# Patient Record
Sex: Male | Born: 1975 | Race: White | Hispanic: No | Marital: Married | Smoking: Never smoker
Health system: Southern US, Community
[De-identification: ages and names within clinical notes are randomized; demographics above are authoritative.]

## PROBLEM LIST (undated history)

## (undated) DIAGNOSIS — E785 Hyperlipidemia, unspecified: Secondary | ICD-10-CM

## (undated) DIAGNOSIS — I10 Essential (primary) hypertension: Secondary | ICD-10-CM

## (undated) HISTORY — DX: Hyperlipidemia, unspecified: E78.5

## (undated) HISTORY — PX: KIDNEY SURGERY: SHX687

## (undated) HISTORY — PX: WISDOM TOOTH EXTRACTION: SHX21

## (undated) HISTORY — DX: Essential (primary) hypertension: I10

---
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
## 2000-07-13 ENCOUNTER — Emergency Department (HOSPITAL_COMMUNITY): Admission: EM | Admit: 2000-07-13 | Discharge: 2000-07-13 | Payer: Self-pay | Admitting: Emergency Medicine

## 2001-05-01 ENCOUNTER — Emergency Department (HOSPITAL_COMMUNITY): Admission: EM | Admit: 2001-05-01 | Discharge: 2001-05-01 | Payer: Self-pay | Admitting: Emergency Medicine

## 2006-06-27 ENCOUNTER — Ambulatory Visit: Payer: Self-pay | Admitting: Gastroenterology

## 2011-05-03 NOTE — Consult Note (Signed)
NAME:  KOHEI, ANTONELLIS NO.:  [id number]   MEDICAL RECORD NO.:  [id number]          PATIENT TYPE:  AMB   LOCATION:                                FACILITY:  [facility]   PHYSICIAN:  Kassie Mends, M.D.           DATE OF BIRTH:   DATE OF CONSULTATION:  06/27/2006  DATE OF DISCHARGE:                                   CONSULTATION   Laverle Hobby, M.D.  [address]  [place], [place] [address]   Dear Dr. Wende Crease,   I am seeing Mr. Godsey as a new patient consultation per your request.  I am  seeing him for a history of ulcerative colitis.   Mr. Zanni is a 35 year old male who reports being diagnosed with ulcerative  colitis in 80 or 1995.  His diagnosis was made in [place].  A physician  made his diagnosis; his office is not available, and obtaining the original  pathology report may be challenging.  He reports having 2-to-3 flares in the  last 5 years.  He has been maintained on no long-term medication.  He was  seen in your office in June 2007 complaining of rectal bleeding.  He was  prescribed sulfasalazine 500 milligrams 2 q.i.d.  He has been fairly  compliant with his regimen.  The bleeding and the cramping have pretty much  resolved.  He saw a little bleeding yesterday.  He denies any fever.  He is  having one bowel movement a day.  He denies any jaundice.  He has no  nocturnal stools; rarely does he have rectal pain.  He usually has rectal  urgency.   His past medical history includes low back pain and asthma.  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  He is allergic  to penicillin.  He is currently on sulfasalazine.  He has no family history  of colon cancer or colon polyps.  He has a family history of diabetes,  arthritis, and blood clots.  He has one to two beers a month.  He denies any  tobacco use.  He does drafting for a living.  He has been married for 6  years.  His review of systems is as per the history of present  illness,  otherwise, all systems are negative.   On physical exam:  Weight 284 lbs., BMI 38.4 (obese), temperature 98.2.  Blood pressure 142/90, pulse 76.  In general he is in no apparent distress,  alert and oriented x4.  HEENT exam is atraumatic normocephalic.  Pupils  equally and reacted to light.  Mouth--no oral lesions.  Posterior pharynx  without erythema or exudate.  His neck has full range of motion and no  lymphadenopathy. His lungs are clear to auscultation bilaterally.  His  cardiovascular exam is a regular rhythm, no murmur, normal S1 and S2.  His  abdomen: bowel sounds are present, soft, nontender, nondistended.  No  rebound or guarding.  Obese.  No hepatosplenomegaly.  He has no focal  neurologic deficits.  His extremities are without cyanosis, clubbing,  or  edema. Rectal exam: deferred   Mr. Largo is a 35 year old male with a reported history of ulcerative  colitis.  He has currently had symptomatic improvement with sulfasalazine.  He has some difficulty complying with the therapy due to it being a QID  drug.  He has no evidence of infectious colitis.  He has had the diagnosis  of ulcerative colitis for approximately 13-14 years.  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  He should have surveillance via  colonoscopy every year with biopsies.  Thank you for allowing me to see Mr.  Drost in consultation.  My list of recommendations follow.   Recommend changing to once a day mesalamine preparation if his insurance  will pay for it.  He is given a prescription for Lialda 1.2 gram tablets to  take 2 p.o. daily.  If his insurance will not pay, I may change him to  Asacol 3x a day.  He will be scheduled for a colonoscopy.  Biopsies will be  obtained every 10 cm.  I did discuss with him, that he has dysplasia is  present, then he should have a total colectomy with ileoanal pouch.  He  is  given a handout from the [facility] [facility] [place] in  regards to this ulcerative colitis; and surgery options for ulcerative  colitis.  He was given a handout on how to use the Lialda and the side  effects associated with it.  He should avoid NSAIDs. I will check a CBC  because of his reported history of rectal bleeding.  He also will have a  hepatic function panel today to screen for liver function abnormalities due  to the association between ulcerative colitis and primary sclerosing  cholangitis.  He is asked to try to obtain records from [place]. He will  follow up with me in 3 months.   Please feel free to contact me with additional questions at [phone].      Kassie Mends, M.D.  Electronically Signed     SM/MEDQ  D:  06/27/2006  T:  06/27/2006  Job:  [id number]   cc:   Laverle Hobby, M.D.  [address]  [place], [place] [address]

## 2012-01-10 ENCOUNTER — Other Ambulatory Visit: Payer: Self-pay | Admitting: Urology

## 2012-01-10 ENCOUNTER — Ambulatory Visit (INDEPENDENT_AMBULATORY_CARE_PROVIDER_SITE_OTHER): Payer: Managed Care, Other (non HMO) | Admitting: Urology

## 2012-01-10 DIAGNOSIS — R3915 Urgency of urination: Secondary | ICD-10-CM

## 2012-01-10 DIAGNOSIS — R310 Gross hematuria: Secondary | ICD-10-CM

## 2012-01-14 ENCOUNTER — Other Ambulatory Visit: Payer: Self-pay | Admitting: Urology

## 2012-01-15 ENCOUNTER — Ambulatory Visit: Payer: Self-pay | Admitting: Gastroenterology

## 2012-01-16 ENCOUNTER — Ambulatory Visit (HOSPITAL_COMMUNITY)
Admission: RE | Admit: 2012-01-16 | Discharge: 2012-01-16 | Disposition: A | Discharge status: Home / Self Care | Payer: Managed Care, Other (non HMO) | Source: Ambulatory Visit | Attending: Urology | Admitting: Urology

## 2012-01-16 DIAGNOSIS — R310 Gross hematuria: Secondary | ICD-10-CM | POA: Insufficient documentation

## 2012-01-16 DIAGNOSIS — Z905 Acquired absence of kidney: Secondary | ICD-10-CM | POA: Insufficient documentation

## 2012-01-16 MED ORDER — IOHEXOL 300 MG/ML  SOLN: 150.0000 mL | Freq: Once | INTRAMUSCULAR | Status: DC | PRN

## 2012-01-16 MED ORDER — IOHEXOL 300 MG/ML  SOLN
150.0000 mL | Freq: Once | INTRAMUSCULAR | Status: AC | PRN
  Administered 2012-01-16: 150 mL via INTRAVENOUS

## 2012-02-28 ENCOUNTER — Ambulatory Visit (INDEPENDENT_AMBULATORY_CARE_PROVIDER_SITE_OTHER): Payer: Managed Care, Other (non HMO) | Admitting: Urology

## 2012-02-28 DIAGNOSIS — N342 Other urethritis: Secondary | ICD-10-CM

## 2012-02-28 DIAGNOSIS — R310 Gross hematuria: Secondary | ICD-10-CM

## 2012-09-04 ENCOUNTER — Ambulatory Visit (INDEPENDENT_AMBULATORY_CARE_PROVIDER_SITE_OTHER): Payer: Managed Care, Other (non HMO) | Admitting: Urology

## 2012-09-04 DIAGNOSIS — N342 Other urethritis: Secondary | ICD-10-CM

## 2012-09-04 DIAGNOSIS — R310 Gross hematuria: Secondary | ICD-10-CM

## 2013-04-30 IMAGING — CT CT ABD-PEL WO/W CM
2 of 7 series · 13 of 46 positions shown, 18 images · IV contrast (Omnipaque 300)
Comparison: None.

CLINICAL DATA: Gross hematuria for 6 months.  History of [DATE] of the
left kidney being removed secondary to a "birth defect".

CT ABDOMEN AND PELVIS WITHOUT AND WITH CONTRAST
TECHNIQUE: Multidetector CT imaging of the abdomen and pelvis was
performed without contrast material in one or both body regions,
followed by contrast material(s) and further sections in one or
both body regions.
Contrast: 150mL OMNIPAQUE IOHEXOL 300 MG/ML IV SOLN

[Series 3: hematuria axial pre contrast · axial · non-contrast · 0.85mm/px · z∈[-458,-24]mm · 10 of 103 slices shown, 15 images]
[im 8/103  soft-tissue]
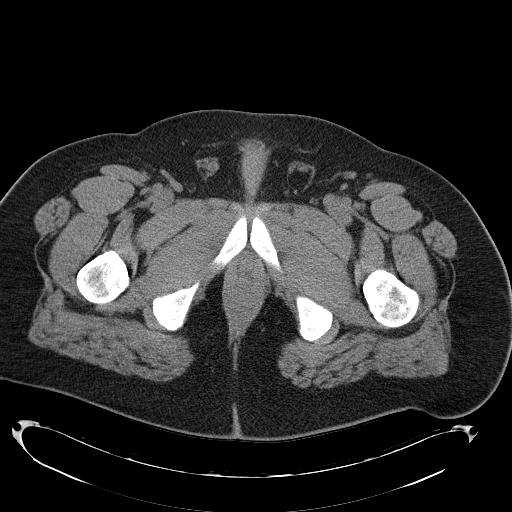
[im 8/103  bone]
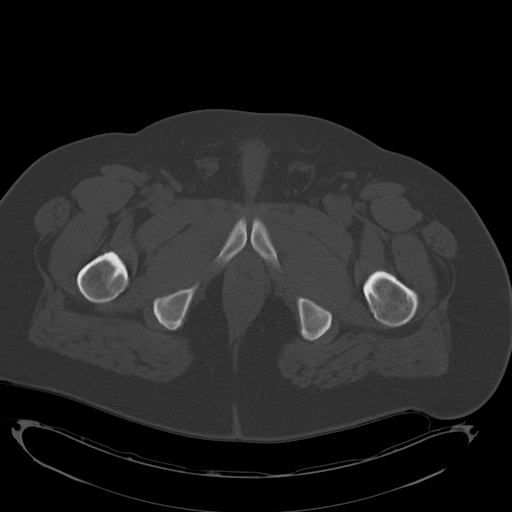
[im 24/103  soft-tissue]
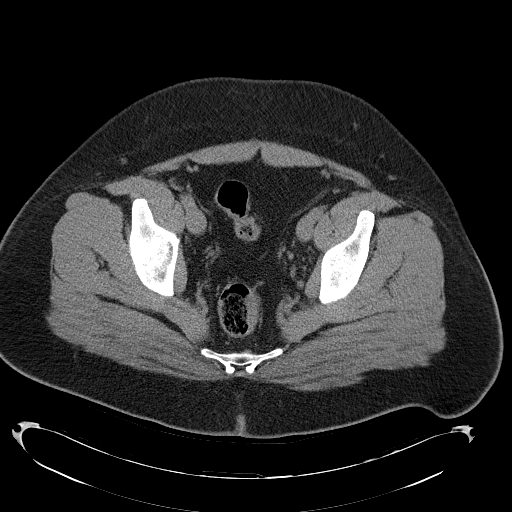
[im 32/103  soft-tissue]
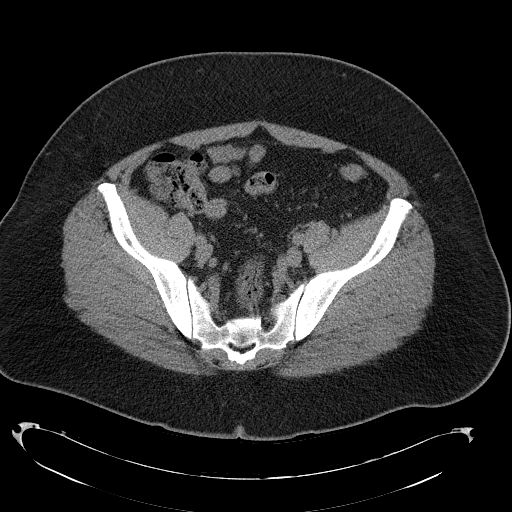
[im 40/103  soft-tissue]
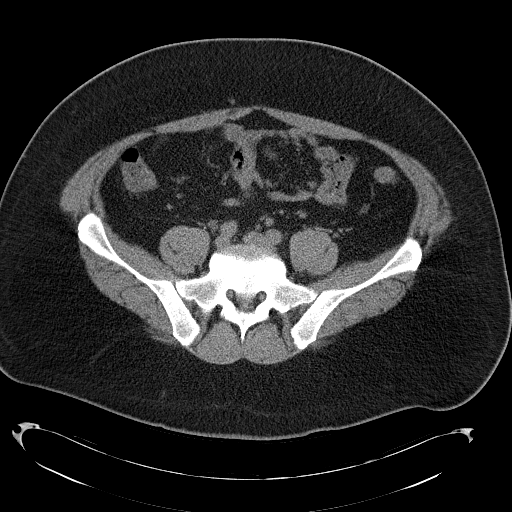
[im 55/103  soft-tissue]
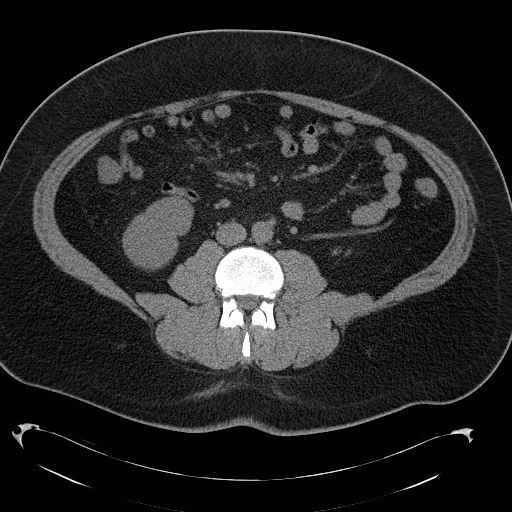
[im 63/103  soft-tissue]
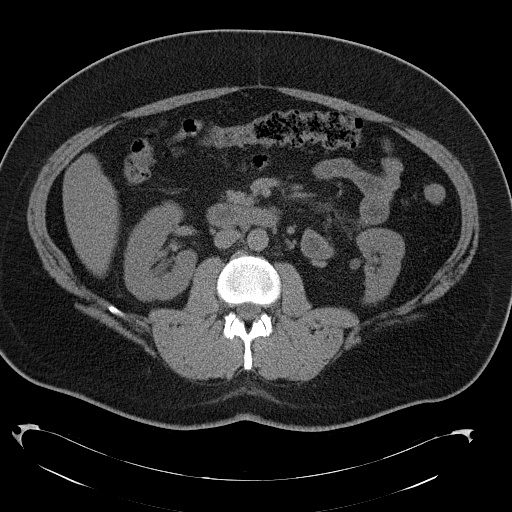
[im 71/103  soft-tissue]
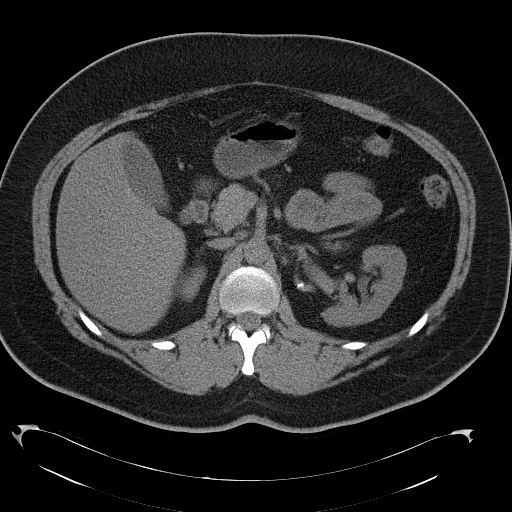
[im 71/103  lung]
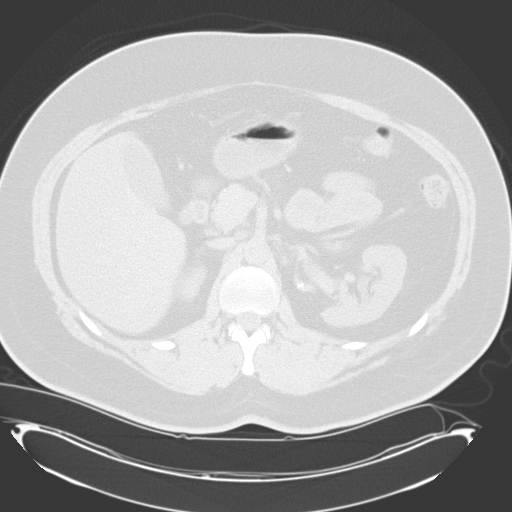
[im 79/103  lung]
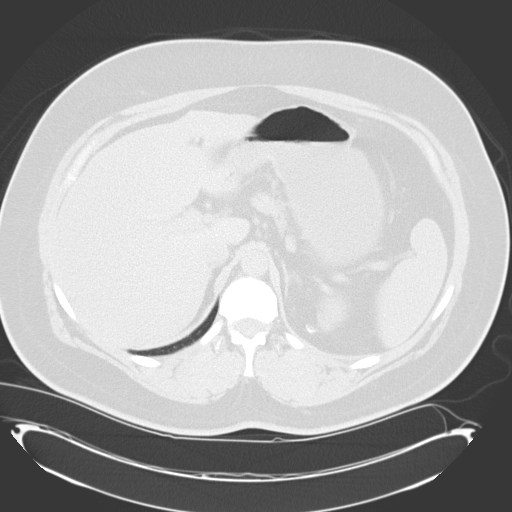
[im 87/103  soft-tissue]
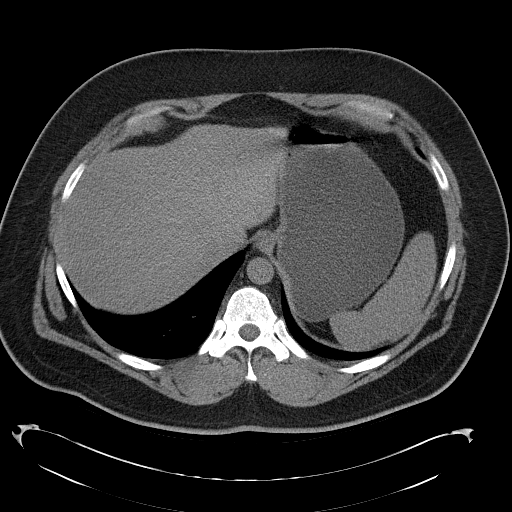
[im 87/103  lung]
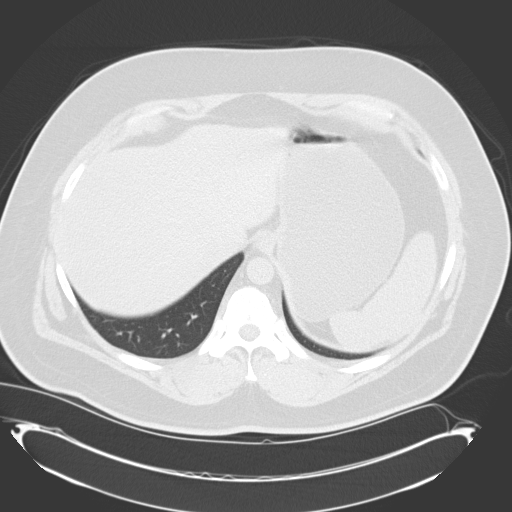
[im 95/103  soft-tissue]
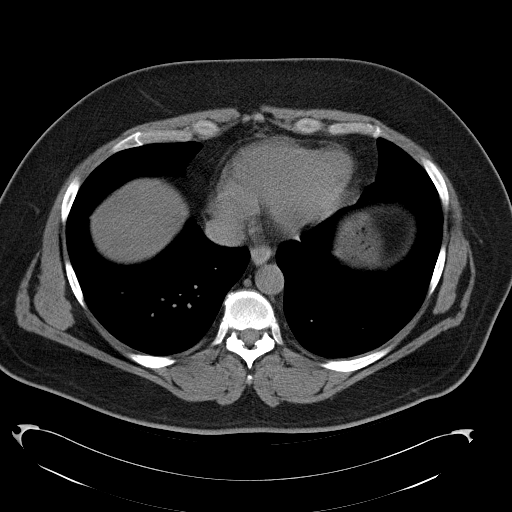
[im 95/103  lung]
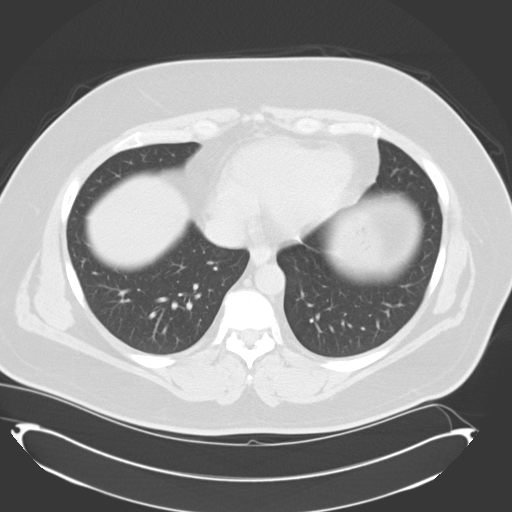
[im 95/103  bone]
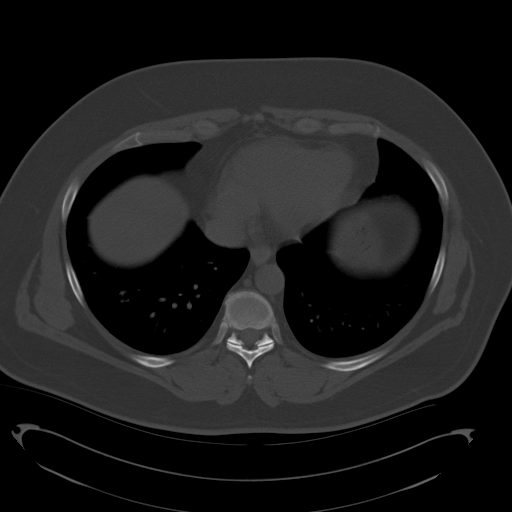

[Series 4: hematuria mpr coro pre (id) · coronal · non-contrast · 0.86mm/px · 3 of 95 slices shown]
[im 24/95  soft-tissue]
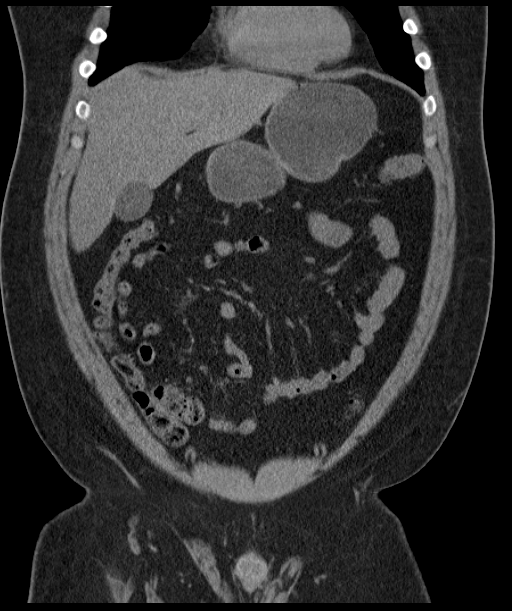
[im 48/95  soft-tissue]
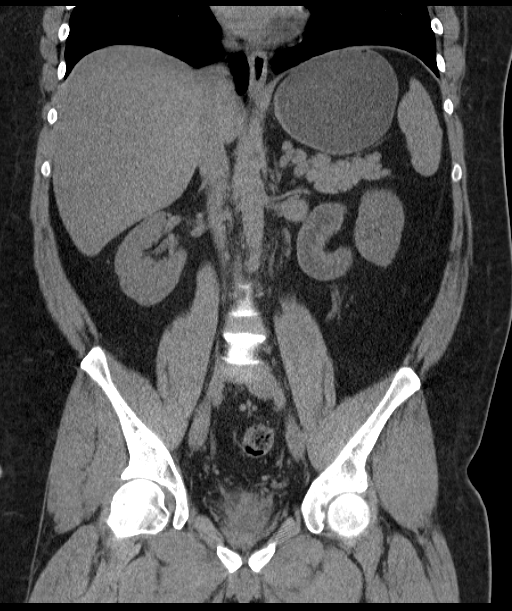
[im 71/95  soft-tissue]
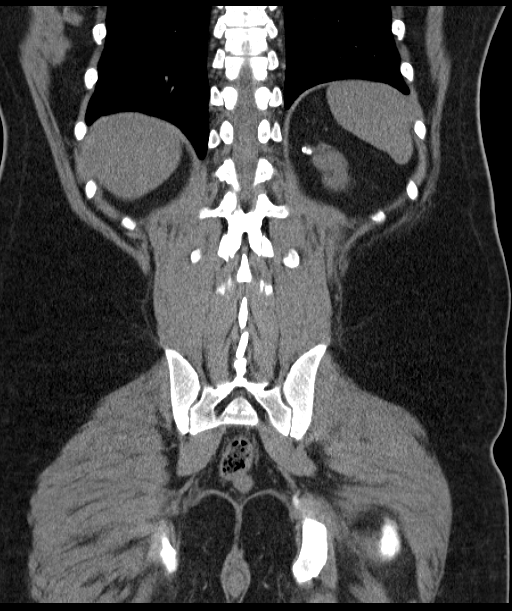

[13 of 46 positions shown; findings below may reference images not displayed]

FINDINGS: Lung Bases:  The visualized lung parenchyma is clear.  No pleural
effusions.  Visualized portions of the heart are unremarkable.

Abdomen/Pelvis:  Pre and post contrast images of the liver,
gallbladder, pancreas, spleen, right kidney and bilateral adrenal
glands are unremarkable.  Postoperative changes of partial left
nephrectomy are noted.  There is a small dystrophic calcification
at the superior aspect of the left kidney.  Numerous clips are
present around the left renal hilum.  A single calcification is
present in the left renal hilum (image 32 of series 3) measuring 3
mm.  This does not appear to be associated with the collecting
system, and is also favored to represent a dystrophic calcification
related to prior surgery.  No ureteral calculi or hydroureter
nephrosis is identified on either side.  Prone delayed postcontrast
images demonstrate no definite filling defects within either
collecting system, or within either ureter.  No filling defects
noted within the urinary bladder.  Normal appendix.  No ascites or
pneumoperitoneum and no pathologic distension of bowel.  No
pathologic lymphadenopathy.  Prostate gland is unremarkable in
appearance.

Musculoskeletal:  No aggressive appearing lytic or blastic lesions
noted in the visualized portions of the skeleton.
IMPRESSION: 1.  Status post partial left nephrectomy with two foci of
dystrophic calcification associated with the left kidney (an
expected postoperative change).  No findings to account for the
patient's hematuria.  Specifically, no evidence of urothelial
neoplasm, no renal neoplasm, or abnormal urinary tract
calcifications.

## 2013-08-31 ENCOUNTER — Telehealth: Payer: Self-pay

## 2013-08-31 NOTE — Telephone Encounter (Signed)
Pt was referred by Dr. Dwana Melena for screening colonoscopy ( and ulcerative colitis). I called and LMOM for pt to return call to schedule ov appt.

## 2013-09-15 NOTE — Telephone Encounter (Signed)
Pt left VM that he is ready to schedule OV. I called, got VM and left message for a return call .

## 2013-09-15 NOTE — Telephone Encounter (Signed)
CS has made OV for pt on 10/22 at 230

## 2013-10-06 ENCOUNTER — Ambulatory Visit (INDEPENDENT_AMBULATORY_CARE_PROVIDER_SITE_OTHER): Payer: Managed Care, Other (non HMO) | Admitting: Gastroenterology

## 2013-10-06 ENCOUNTER — Encounter: Payer: Self-pay | Admitting: Gastroenterology

## 2013-10-06 ENCOUNTER — Encounter (INDEPENDENT_AMBULATORY_CARE_PROVIDER_SITE_OTHER): Payer: Self-pay

## 2013-10-06 VITALS — BP 128/82 | HR 88 | Temp 97.0°F | Ht 72.0 in | Wt 297.6 lb

## 2013-10-06 DIAGNOSIS — K5190 Ulcerative colitis, unspecified, without complications: Secondary | ICD-10-CM | POA: Insufficient documentation

## 2013-10-06 MED ORDER — PEG 3350-KCL-NA BICARB-NACL 420 G PO SOLR: 4000.0000 mL | ORAL | Status: DC

## 2013-10-06 NOTE — Progress Notes (Signed)
Referring Provider: Catalina Pizza, MD Primary Care Physician:  Catalina Pizza, MD Primary Gastroenterologist:  Dr. Jena Gauss   Chief Complaint  Patient presents with  . Colonoscopy    HPI:   Terry Woodward is a pleasant 37 year old male who has a history of ulcerative colitis, originally diagnosed in 28. This was made in [place] at time of colonoscopy. He states he was never provided any medication from diagnosing physician. He has not had a lower GI evaluation since then. He was actually seen by our office in 2007 and provided Lialda. Took this for awhile, but he has been off all medications for years. Will periodically have low-volume hematochezia, painless in nature. BM every day like clockwork, no diarrhea. Flare once a year, resulting in about a day off from work. Ends up with large amount of mucus, rectal bleeding, more frequent, watery stools, cramps. Advil intermittently for arthritis, headaches. No aspirin powders.   Past Medical History  Diagnosis Date  . Dyslipidemia   . Hypertension     Past Surgical History  Procedure Laterality Date  . Colonoscopy  1994    dx: UC  . 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 Wisdom tooth extraction      No current outpatient prescriptions on file.   No current facility-administered medications for this visit.    Allergies as of 10/06/2013 - Review Complete 10/06/2013  Allergen Reaction Noted  . Penicillins Other (See Comments) 10/06/2013    Family History  Problem Relation Age of Onset  . Colon cancer Maternal Grandmother   . Colon cancer Maternal Grandfather   . Colon cancer Paternal Grandmother   . Ovarian cancer Mother     History   Social History  . Marital Status: Single    Spouse Name: N/A    Number of Children: N/A  . Years of Education: N/A   Occupational History  . [employer]    Social History Main Topics  . Smoking status: Never Smoker   . Smokeless tobacco: Not on file   . Alcohol Use: Yes     Comment: 1-2 beers a month  . Drug Use: No  . Sexual Activity: Not on file   Other Topics Concern  . Not on file   Social History Narrative  . No narrative on file    Review of Systems: Gen: Denies any fever, chills, loss of appetite, fatigue, weight loss. CV: Denies chest pain, heart palpitations, syncope, peripheral edema. Resp: Denies shortness of breath with rest, cough, wheezing GI: see HPI GU : Denies urinary burning, urinary frequency, urinary incontinence.  MS: arthritis in back  Derm: Denies rash, itching, dry skin Psych: Denies depression, anxiety, confusion or memory loss  Heme: Denies bruising, bleeding, and enlarged lymph nodes.  Physical Exam: BP 128/82  Pulse 88  Temp(Src) 97 F (36.1 C) (Oral)  Ht 6' (1.829 m)  Wt 297 lb 9.6 oz (134.99 kg)  BMI 40.35 kg/m2 General:   Alert and oriented. Well-developed, well-nourished, pleasant and cooperative. Head:  Normocephalic and atraumatic. Eyes:  Conjunctiva pink, sclera clear, no icterus.   Conjunctiva pink. Ears:  Normal auditory acuity. Nose:  No deformity, discharge,  or lesions. Mouth:  Lower jaw protrudes forward, resulting in underbite Neck:  Supple, without mass or thyromegaly. Lungs:  Clear to auscultation bilaterally, without wheezing, rales, or rhonchi.  Heart:  S1, S2 present without murmurs noted.  Abdomen:  +BS, soft, non-tender and non-distended. Without mass  or HSM. No rebound or guarding. No hernias noted. Rectal:  Deferred  Msk:  Symmetrical without gross deformities. Normal posture. Extremities:  Without clubbing or edema. Neurologic:  Alert and  oriented x4;  grossly normal neurologically. Skin:  Intact, warm and dry without significant lesions or rashes Cervical Nodes:  No significant cervical adenopathy. Psych:  Alert and cooperative. Normal mood and affect.  Outside labs March 2014: Hgb 15.3, Hct 43.8 LFTs normal March 2014

## 2013-10-06 NOTE — Patient Instructions (Signed)
We have scheduled you for a colonoscopy with Dr. Rourk in the near future.  Further recommendations to follow!   

## 2013-10-06 NOTE — Assessment & Plan Note (Signed)
37 year old male with reported history of ulcerative colitis, diagnosed in 57 while living in [place], now with need for surveillance colonoscopy. He has not had any lower GI evaluation since original diagnosis; he also denies taking any medication. Path reports unavailable at time of visit. Appears he will have a flare around once a year, but otherwise, he is doing quite well.   Proceed with TCS with Dr. Jena Gauss in near future: the risks, benefits, and alternatives have been discussed with the patient in detail. The patient states understanding and desires to proceed. Further recommendations including medication regimen after colonoscopy

## 2013-10-07 NOTE — Progress Notes (Signed)
cc'd to pcp 

## 2013-10-13 LAB — CBC WITH DIFFERENTIAL/PLATELET
HCT: 44 %
HGB: 15.3 g/dL
platelet count: 301

## 2013-10-13 LAB — COMPREHENSIVE METABOLIC PANEL
AST: 22 U/L
Total Bilirubin: 0.8 mg/dL

## 2013-10-14 ENCOUNTER — Encounter (HOSPITAL_COMMUNITY): Payer: Self-pay | Admitting: Pharmacy Technician

## 2013-10-29 ENCOUNTER — Ambulatory Visit (HOSPITAL_COMMUNITY)
Admission: RE | Admit: 2013-10-29 | Discharge: 2013-10-29 | Disposition: A | Discharge status: Home / Self Care | Payer: Managed Care, Other (non HMO) | Source: Ambulatory Visit | Attending: Internal Medicine | Admitting: Internal Medicine

## 2013-10-29 ENCOUNTER — Encounter (HOSPITAL_COMMUNITY): Payer: Self-pay | Admitting: *Deleted

## 2013-10-29 ENCOUNTER — Encounter (HOSPITAL_COMMUNITY)
Admission: RE | Disposition: A | Discharge status: Home / Self Care | Payer: Self-pay | Source: Ambulatory Visit | Attending: Internal Medicine

## 2013-10-29 DIAGNOSIS — Z1211 Encounter for screening for malignant neoplasm of colon: Secondary | ICD-10-CM

## 2013-10-29 DIAGNOSIS — Z800 Family history of malignant neoplasm of digestive organs: Secondary | ICD-10-CM | POA: Insufficient documentation

## 2013-10-29 DIAGNOSIS — E785 Hyperlipidemia, unspecified: Secondary | ICD-10-CM | POA: Insufficient documentation

## 2013-10-29 DIAGNOSIS — K5190 Ulcerative colitis, unspecified, without complications: Secondary | ICD-10-CM

## 2013-10-29 DIAGNOSIS — I10 Essential (primary) hypertension: Secondary | ICD-10-CM | POA: Insufficient documentation

## 2013-10-29 HISTORY — PX: COLONOSCOPY: SHX5424

## 2013-10-29 SURGERY — COLONOSCOPY
Anesthesia: Moderate Sedation

## 2013-10-29 MED ORDER — MIDAZOLAM HCL 5 MG/5ML IJ SOLN
INTRAMUSCULAR | Status: AC
  Filled 2013-10-29: qty 10

## 2013-10-29 MED ORDER — MEPERIDINE HCL 100 MG/ML IJ SOLN
INTRAMUSCULAR | Status: DC | PRN
  Administered 2013-10-29 (×2): 50 mg via INTRAVENOUS
  Administered 2013-10-29: 25 mg via INTRAVENOUS

## 2013-10-29 MED ORDER — SODIUM CHLORIDE 0.9 % IV SOLN
INTRAVENOUS | Status: DC
  Administered 2013-10-29: 08:00:00 via INTRAVENOUS

## 2013-10-29 MED ORDER — ONDANSETRON HCL 4 MG/2ML IJ SOLN
INTRAMUSCULAR | Status: AC
  Filled 2013-10-29: qty 2

## 2013-10-29 MED ORDER — MIDAZOLAM HCL 5 MG/5ML IJ SOLN
INTRAMUSCULAR | Status: DC | PRN
  Administered 2013-10-29 (×2): 2 mg via INTRAVENOUS
  Administered 2013-10-29 (×2): 1 mg via INTRAVENOUS

## 2013-10-29 MED ORDER — ONDANSETRON HCL 4 MG/2ML IJ SOLN
INTRAMUSCULAR | Status: DC | PRN
  Administered 2013-10-29: 4 mg via INTRAVENOUS

## 2013-10-29 MED ORDER — MEPERIDINE HCL 100 MG/ML IJ SOLN
INTRAMUSCULAR | Status: AC
  Filled 2013-10-29: qty 2

## 2013-10-29 MED ORDER — STERILE WATER FOR IRRIGATION IR SOLN
Status: DC | PRN
  Administered 2013-10-29: 09:00:00

## 2013-10-29 NOTE — Interval H&P Note (Signed)
History and Physical Interval Note:  10/29/2013 8:30 AM  Terry Woodward  has presented today for surgery, with the diagnosis of ULCERATIVE COLITIS  The various methods of treatment have been discussed with the patient and family. After consideration of risks, benefits and other options for treatment, the patient has consented to  Procedure(s) with comments: COLONOSCOPY (N/A) - 8:45 as a surgical intervention .  The patient's history has been reviewed, patient examined, no change in status, stable for surgery.  I have reviewed the patient's chart and labs.  Questions were answered to the patient's satisfaction.     Terry Woodward  No change. Colonoscopy per plan.  The risks, benefits, limitations, alternatives and imponderables have been reviewed with the patient. Questions have been answered. All parties are agreeable.

## 2013-10-29 NOTE — H&P (View-Only) (Signed)
 Referring Provider: Hall, Zach, MD Primary Care Physician:  HALL, ZACH, MD Primary Gastroenterologist:  Dr. Rourk   Chief Complaint  Patient presents with  . Colonoscopy    HPI:   Mr. Heldman is a pleasant 37-year-old male who has a history of ulcerative colitis, originally diagnosed in 1994. This was made in [place] at time of colonoscopy. He states he was never provided any medication from diagnosing physician. He has not had a lower GI evaluation since then. He was actually seen by our office in 2007 and provided Lialda. Took this for awhile, but he has been off all medications for years. Will periodically have low-volume hematochezia, painless in nature. BM every day like clockwork, no diarrhea. Flare once a year, resulting in about a day off from work. Ends up with large amount of mucus, rectal bleeding, more frequent, watery stools, cramps. Advil intermittently for arthritis, headaches. No aspirin powders.   Past Medical History  Diagnosis Date  . Dyslipidemia   . Hypertension     Past Surgical History  Procedure Laterality Date  . Colonoscopy  1994    dx: UC  . 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 Wisdom tooth extraction      No current outpatient prescriptions on file.   No current facility-administered medications for this visit.    Allergies as of 10/06/2013 - Review Complete 10/06/2013  Allergen Reaction Noted  . Penicillins Other (See Comments) 10/06/2013    Family History  Problem Relation Age of Onset  . Colon cancer Maternal Grandmother   . Colon cancer Maternal Grandfather   . Colon cancer Paternal Grandmother   . Ovarian cancer Mother     History   Social History  . Marital Status: Single    Spouse Name: N/A    Number of Children: N/A  . Years of Education: N/A   Occupational History  . [employer]    Social History Main Topics  . Smoking status: Never Smoker   . Smokeless tobacco: Not on file   . Alcohol Use: Yes     Comment: 1-2 beers a month  . Drug Use: No  . Sexual Activity: Not on file   Other Topics Concern  . Not on file   Social History Narrative  . No narrative on file    Review of Systems: Gen: Denies any fever, chills, loss of appetite, fatigue, weight loss. CV: Denies chest pain, heart palpitations, syncope, peripheral edema. Resp: Denies shortness of breath with rest, cough, wheezing GI: see HPI GU : Denies urinary burning, urinary frequency, urinary incontinence.  MS: arthritis in back  Derm: Denies rash, itching, dry skin Psych: Denies depression, anxiety, confusion or memory loss  Heme: Denies bruising, bleeding, and enlarged lymph nodes.  Physical Exam: BP 128/82  Pulse 88  Temp(Src) 97 F (36.1 C) (Oral)  Ht 6' (1.829 m)  Wt 297 lb 9.6 oz (134.99 kg)  BMI 40.35 kg/m2 General:   Alert and oriented. Well-developed, well-nourished, pleasant and cooperative. Head:  Normocephalic and atraumatic. Eyes:  Conjunctiva pink, sclera clear, no icterus.   Conjunctiva pink. Ears:  Normal auditory acuity. Nose:  No deformity, discharge,  or lesions. Mouth:  Lower jaw protrudes forward, resulting in underbite Neck:  Supple, without mass or thyromegaly. Lungs:  Clear to auscultation bilaterally, without wheezing, rales, or rhonchi.  Heart:  S1, S2 present without murmurs noted.  Abdomen:  +BS, soft, non-tender and non-distended. Without mass   or HSM. No rebound or guarding. No hernias noted. Rectal:  Deferred  Msk:  Symmetrical without gross deformities. Normal posture. Extremities:  Without clubbing or edema. Neurologic:  Alert and  oriented x4;  grossly normal neurologically. Skin:  Intact, warm and dry without significant lesions or rashes Cervical Nodes:  No significant cervical adenopathy. Psych:  Alert and cooperative. Normal mood and affect.  Outside labs March 2014: Hgb 15.3, Hct 43.8 LFTs normal March 2014   

## 2013-10-29 NOTE — Op Note (Signed)
[facility] [address] [place], [address]   COLONOSCOPY PROCEDURE REPORT  PATIENT: Terry, Woodward  MR#:         [id number] BIRTHDATE: June 03, 1976 , 37  yrs. old GENDER: Male ENDOSCOPIST: R.  Roetta Sessions, MD FACP FACG REFERRED BY:  Catalina Pizza, M.D. PROCEDURE DATE:  10/29/2013 PROCEDURE:     Ileocolonoscopy with segmental biopsy  INDICATIONS: Reported history of ulcerative colitis with no GI symptoms currently; positive family history of colon cancer in 3 second-degree relatives but all at quite an advanced age  INFORMED CONSENT:  The risks, benefits, alternatives and imponderables including but not limited to bleeding, perforation as well as the possibility of a missed lesion have been reviewed.  The potential for biopsy, lesion removal, etc. have also been discussed.  Questions have been answered.  All parties agreeable. Please see the history and physical in the medical record for more information.  MEDICATIONS: Versed 6 mg IV Demerol 125 mg IV in divided doses. Zofran 4 mg IV  DESCRIPTION OF PROCEDURE:  After a digital rectal exam was performed, the EC-[id number] ([id number])  colonoscope was advanced from the anus through the rectum and colon to the area of the cecum, ileocecal valve and appendiceal orifice.  The cecum was deeply intubated.  These structures were well-seen and photographed for the record.  From the level of the cecum and ileocecal valve, the scope was slowly and cautiously withdrawn.  The mucosal surfaces were carefully surveyed utilizing scope tip deflection to facilitate fold flattening as needed.  The scope was pulled down into the rectum where a thorough examination including retroflexion was performed.    FINDINGS:  Adequate preparation. Normal-appearing rectal mucosa. The colonic mucosa appeared normal as well. The distal 10 cm of terminal ileal mucosa also appeared normal.  THERAPEUTIC / DIAGNOSTIC MANEUVERS PERFORMED:  segmental  biopsies of the ascending, descending, sigmoid and rectal segments taken for histologic study  COMPLICATIONS: None  CECAL WITHDRAWAL TIME:  11 minutes  IMPRESSION:  Normal ileocolonoscopy-status post segmental biopsy. Patient's clinical course over the past 20 years not typical of idiopathic inflammatory bowel disease.  RECOMMENDATIONS: Followup on pathology. We'll attempt to get procedure note and pathology results from his original colonoscopy done in [place] some 2 decades ago.   _______________________________ eSigned:  R. Roetta Sessions, MD FACP [facility] 10/29/2013 9:05 AM   CC:

## 2013-11-01 ENCOUNTER — Encounter: Payer: Self-pay | Admitting: Internal Medicine

## 2013-11-02 ENCOUNTER — Encounter (HOSPITAL_COMMUNITY): Payer: Self-pay | Admitting: Internal Medicine

## 2013-11-24 ENCOUNTER — Telehealth: Payer: Self-pay

## 2013-11-24 NOTE — Telephone Encounter (Signed)
Pt's wife called and wanted to speak to someone in billing and I offered her the number for profee. She declined and said she wanted to speak to someone here. I told her my office manager is on the phone at this time and I will have her return your call at [phone].

## 2013-11-25 NOTE — Telephone Encounter (Signed)
I also informed the patient of this resubmission

## 2013-11-25 NOTE — Telephone Encounter (Signed)
Per Terry Woodward the bill will be resubmitted with the corrected codes: 1) V76.51; 2) 556.9 3) V16.0

## 2013-11-25 NOTE — Telephone Encounter (Signed)
I spoke with Laurence Slate, PB coding for [employer], states the V76.51 code was not used and it should have been used.  She is going to make the changes and resubmit the Claim.

## 2013-11-25 NOTE — Telephone Encounter (Signed)
I called and lm with Lavetta Nielsen concerning the hospital account below

## 2013-12-28 NOTE — Telephone Encounter (Signed)
I spoke with Terry LewisAnita Woodward and she stated there was never a resubmission with the correct codes.  She is going e-mail billing and make sure a resubmission of claim is done for Terry Woodward.

## 2013-12-28 NOTE — Telephone Encounter (Signed)
Patient's wife called and stated she was still receiving bills on this account.  I emailed Rosey Batheresa and Lavetta NielsenDonna Knowlton in hospital billing to have them follow-up on why this has not been corrected and resubmitted to his insurance company.

## 2013-12-28 NOTE — Telephone Encounter (Signed)
I called. Terry Woodward and lmom that the claim would be resubmitted.

## 2013-12-28 NOTE — Telephone Encounter (Signed)
Pt's wife Jeanice LimHolly [phone] would like for me to give her a call back.

## 2014-01-20 ENCOUNTER — Other Ambulatory Visit (HOSPITAL_COMMUNITY): Payer: Self-pay

## 2014-01-20 DIAGNOSIS — G479 Sleep disorder, unspecified: Secondary | ICD-10-CM

## 2014-02-18 ENCOUNTER — Encounter: Payer: Self-pay | Admitting: Neurology

## 2014-02-18 ENCOUNTER — Ambulatory Visit: Payer: BC Managed Care – PPO | Attending: Internal Medicine | Admitting: Sleep Medicine

## 2014-02-18 DIAGNOSIS — G479 Sleep disorder, unspecified: Secondary | ICD-10-CM

## 2014-02-18 DIAGNOSIS — G4733 Obstructive sleep apnea (adult) (pediatric): Secondary | ICD-10-CM | POA: Insufficient documentation

## 2014-02-27 NOTE — Sleep Study (Signed)
  [facility] Romie Keeble A. Gerilyn Pilgrimoonquah, MD     [email or web address]          LOCATION: SLEEP LAB FACILITY: [facility]  PHYSICIAN: Jlynn Langille A. Gerilyn Pilgrimoonquah, M.D.   DATE OF STUDY: 02/18/2014  NOCTURNAL POLYSOMNOGRAM   REFERRING PHYSICIAN:  Dwana MelenaZack Hall.  INDICATIONS: This is a 38 year old presents with snoring, obesity and fatigue.  MEDICATIONS:  Prior to Admission medications   Medication Sig Start Date End Date Taking? Authorizing Provider  ibuprofen (ADVIL,MOTRIN) 200 MG tablet Take 600 mg by mouth every 6 (six) hours as needed for pain.    Historical Provider, MD      EPWORTH SLEEPINESS SCALE: 4.   BMI: 40.   ARCHITECTURAL SUMMARY: Total recording time was 400 minutes. Sleep efficiency 87 %. Sleep latency 21 minutes. REM latency 212 minutes. Stage NI 9 %, N2 57 % and N3 19 % and REM sleep 14 %.    RESPIRATORY DATA:  Baseline oxygen saturation is 97 %. The lowest saturation is 89 %. The diagnostic AHI is 5. The RDI is 13. The REM AHI is 7.  LIMB MOVEMENT SUMMARY: PLM index 5.   ELECTROCARDIOGRAM SUMMARY: Average heart rate is 72 with no significant  dysrhythmias observed.   IMPRESSION:  1. Mild obstructive sleep apnea syndrome not requiring positive pressure treatment.  Thanks for this referral.  Maguire Sime A. Gerilyn Pilgrimoonquah, M.D. Diplomat, Biomedical engineerAmerican Board of Sleep Medicine.

## 2014-12-07 ENCOUNTER — Ambulatory Visit: Payer: Self-pay

## 2014-12-13 ENCOUNTER — Ambulatory Visit (INDEPENDENT_AMBULATORY_CARE_PROVIDER_SITE_OTHER): Payer: BC Managed Care – PPO

## 2014-12-13 ENCOUNTER — Ambulatory Visit: Payer: BC Managed Care – PPO

## 2014-12-13 VITALS — BP 112/70 | HR 88 | Resp 18

## 2014-12-13 DIAGNOSIS — R52 Pain, unspecified: Secondary | ICD-10-CM

## 2014-12-13 DIAGNOSIS — M2040 Other hammer toe(s) (acquired), unspecified foot: Secondary | ICD-10-CM

## 2014-12-13 DIAGNOSIS — M7750 Other enthesopathy of unspecified foot: Secondary | ICD-10-CM

## 2014-12-13 DIAGNOSIS — M6240 Contracture of muscle, unspecified site: Secondary | ICD-10-CM

## 2014-12-13 DIAGNOSIS — M2010 Hallux valgus (acquired), unspecified foot: Secondary | ICD-10-CM

## 2014-12-13 DIAGNOSIS — M779 Enthesopathy, unspecified: Secondary | ICD-10-CM

## 2014-12-13 DIAGNOSIS — M778 Other enthesopathies, not elsewhere classified: Secondary | ICD-10-CM

## 2014-12-13 NOTE — Patient Instructions (Signed)
Bunionectomy A bunionectomy is surgery to remove a bunion. A bunion is an enlargement of the joint at the base of the big toe. It is made up of bone and soft tissue on the inside part of the joint. Over time, a painful lump appears on the inside of the joint. The big toe begins to point inward toward the second toe. New bone growth can occur and a bone spur may form. The pain eventually causes difficulty walking. A bunion usually results from inflammation caused by the irritation of poorly fitting shoes. It often begins later in life. A bunionectomy is performed when nonsurgical treatment no longer works. When surgery is needed, the extent of the procedure will depend on the degree of deformity of the foot. Your surgeon will discuss with you the different procedures and what will work best for you depending on your age and health. LET YOUR CAREGIVER KNOW ABOUT:   Previous problems with anesthetics or medicines used to numb the skin.  Allergies to dyes, iodine, foods, and/or latex.  Medicines taken including herbs, eye drops, prescription medicines (especially medicines used to "thin the blood"), aspirin and other over-the-counter medicines, and steroids (by mouth or as a cream).  History of bleeding or blood problems.  Possibility of pregnancy, if this applies.  History of blood clots in your legs and/or lungs .  Previous surgery.  Other important health problems. RISKS AND COMPLICATIONS   Infection.  Pain.  Nerve damage.  Possibility that the bunion will recur. BEFORE THE PROCEDURE  You should be present 60 minutes prior to your procedure or as directed.  PROCEDURE  Surgery is often done so that you can go home the same day (outpatient). It may be done in a hospital or in an outpatient surgical center. An anesthetic will be used to help you sleep during the procedure. Sometimes, a spinal anesthetic is used to make you numb below the waist. A cut (incision) is made over the swollen  area at the first joint of the big toe. The enlarged lump will be removed. If there is a need to reposition the bones of the big toe, this may require more than 1 incision. The bone itself may need to be cut. Screws and wires may be used in the repair. These can be removed at a later date. In severe cases, the entire joint may need to be removed and a joint replacement inserted. When done, the incision is closed with stitches (sutures). Skin adhesive strips may be added for reinforcement. They help hold the incision closed.  AFTER THE PROCEDURE  Compression bandages (dressings) are then wrapped around the wound. This helps to keep the foot in alignment and reduce swelling. Your foot will be monitored for bleeding and swelling. You will need to stay for a few hours in the recovery area before being discharged. This allows time for the anesthesia to wear off. You will be discharged home when you are awake, stable, and doing well. HOME CARE INSTRUCTIONS   You can expect to return to normal activities within 6 to 8 weeks after surgery. The foot is at increased risk for swelling for several months. When you can expect to bear weight on the operated foot will depend on the extent of your surgery. The milder the deformity, the less tissue is removed and the sooner the return to normal activity level. During the recovery period, a special shoe, boot, or cast may be worn to accommodate the surgical bandage and to help provide stability   to the foot.  Once you are home, an ice pack applied to the operative site may help with discomfort and keep swelling down. Stop using the ice if it causes discomfort.  Keep your feet raised (elevated) when possible to lessen swelling.  If you have an elastic bandage on your foot and you have numbness, tingling, or your foot becomes cold and blue, adjust the bandage to make it comfortable.  Change dressings as directed.  Keep the wound dry and clean. The wound may be washed  gently with soap and water. Gently blot dry without rubbing. Do not take baths or use swimming pools or hot tubs for 10 days, or as instructed by your caregiver.  Only take over-the-counter or prescription medicines for pain, discomfort, or fever as directed by your caregiver.  You may continue a normal diet as directed.  For activity, use crutches with no weight bearing or your orthopedic shoe as directed. Continue to use crutches or a cane as directed until you can stand without causing pain. SEEK MEDICAL CARE IF:   You have redness, swelling, bruising, or increasing pain in the wound.  There is pus coming from the wound.  You have drainage from a wound lasting longer than 1 day.  You have an oral temperature above 102 F (38.9 C).  You notice a bad smell coming from the wound or dressing.  The wound breaks open after sutures have been removed.  You develop dizzy episodes or fainting while standing.  You have persistent nausea or vomiting.  Your toes become cold.  Pain is not relieved with medicines. SEEK IMMEDIATE MEDICAL CARE IF:   You develop a rash.  You have difficulty breathing.  You develop any reaction or side effects to medicines given.  Your toes are numb or blue, or you have severe pain. MAKE SURE YOU:   Understand these instructions.  Will watch your condition.  Will get help right away if you are not doing well or get worse. Document Released: 11/15/2005 Document Revised: 02/24/2012 Document Reviewed: 12/21/2007 [employer] Patient Information 2015 [employer]. This information is not intended to replace advice given to you by your health care provider. Make sure you discuss any questions you have with your health care provider.  

## 2014-12-13 NOTE — Progress Notes (Signed)
   Subjective:    Patient ID: Terry Woodward, male    DOB: 09/12/1976, 38 y.o.   MRN: [id number]  HPI I AM HAVING SOME PAIN IN BOTH OF MY BIG TOES AND HAS BEEN GOING ON FOR ABOUT 2 YEARS AND THERE IS NO SORENESS OR THROBBING AND NO NUMBNESS OR TINGLING AND NO SWELLING AND MY TOES ARE TURNING IN    Review of Systems  All other systems reviewed and are negative.      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 Elijah Birkom. Patient has painful bunions both feet left more so than right with lateral deviation of the hallux and of the hallux under lapping second digits bilateral there is some dorsal displacement and second digit bilateral left more so than right. There is no pain on range of motion some tenderness on certain shoe wear and certain activities the second toe sometimes is pushed up out of the way rubs against shoes. Patient is tried wider shoes changing padding and cushioning always left little success at this time we discussed options and x-rays confirm elevated I am angle greater than 13-14 bilateral sesamoid position 4-5 hallux abductus angle greater than 25 deviation of sesamoids noted some mild asymmetric joint space narrowing noted there is lateral deviation of the hallux under lapping second digits bilateral although second digits are flexible in nature likely soft tissue contractures noted an extensor tendon contracture noted on seconds bilateral. Neurovascular status otherwise intact pedal pulses are palpable epicritic and proprioceptive sensations intact and symmetric no open wounds no ulcers       Assessment & Plan:  Assessment this time is hallux abductovalgus to 40 moderate deformity with some mild capsulitis and under lapping the second digit with early hammertoe contracture and tenotomy 1010 tendinitis or tendon contracture at the MTP  joints. Plan at this time discussed options dispensed literature on bunion and hammertoe repair patient will consider this is a work project for the next 6 months however within 6 months will likely return for surgery consultation literature about bunion and hammertoe repair dispensed for him to review and consider likely outpatient surgery IV sedation local anesthetic block and regional anesthesia. Will be reevaluated ready for surgery sometime in the next 3-6 months. In the interim suggested bunion pads cushions wider more accommodative shoes NSAIDs as needed in the interim.  Alvan Dameichard Ragena Fiola DPM

## 2021-02-21 ENCOUNTER — Other Ambulatory Visit: Payer: Self-pay

## 2021-02-21 ENCOUNTER — Ambulatory Visit (INDEPENDENT_AMBULATORY_CARE_PROVIDER_SITE_OTHER): Payer: 59

## 2021-02-21 ENCOUNTER — Encounter: Payer: Self-pay | Admitting: Podiatry

## 2021-02-21 ENCOUNTER — Ambulatory Visit (INDEPENDENT_AMBULATORY_CARE_PROVIDER_SITE_OTHER): Payer: 59 | Admitting: Podiatry

## 2021-02-21 DIAGNOSIS — M79672 Pain in left foot: Secondary | ICD-10-CM

## 2021-02-21 DIAGNOSIS — M79671 Pain in right foot: Secondary | ICD-10-CM

## 2021-02-21 DIAGNOSIS — M21619 Bunion of unspecified foot: Secondary | ICD-10-CM

## 2021-02-21 NOTE — Progress Notes (Signed)
Subjective:   Patient ID: Terry Woodward, male   DOB: 45 y.o.   MRN: [id number]   HPI Patient presents stating he has had severe bunion deformity left over right for a number of years and it is worsened over the last few months.  States he is tried wider shoes he is tried soaks and he has significant family history with both parents and brother and sister having had corrective surgery.  States the left is worse and is making increasingly difficult for him to wear his shoes with work.  Patient does not smoke likes to be active   Review of Systems  All other systems reviewed and are negative.       Objective:  Physical Exam Vitals and nursing note reviewed.  Constitutional:      Appearance: He is well-developed and well-nourished.  Cardiovascular:     Pulses: Intact distal pulses.  Pulmonary:     Effort: Pulmonary effort is normal.  Musculoskeletal:        General: Normal range of motion.  Skin:    General: Skin is warm.  Neurological:     Mental Status: He is alert.     Neurovascular status intact muscle strength adequate range of motion adequate with patient found to have prominent first metatarsal of the left over right with deviation of the hallux left over right under line the second toe.  It is red and painful when pressed with patient found to have good digital perfusion well oriented x3     Assessment:  Significant structural bunion deformity left over right with symptomatic condition and failure to respond conservatively with numerous conservative attempts     Plan:  H&P all conditions reviewed recommended structural correction with distal osteotomy along with Akin osteotomy left and educated patient on procedures.  Patient wants to get this done soon and I allowed him to read consent form going over condition and alternative treatments.  Patient wants surgery and after extensive review signed consent form understanding total recovery can take approximately 6 months with  no long-term guarantees and I did dispense air fracture walker with all instructions on usage today.  Encouraged to call with questions prior to procedure  X-rays indicate significant elevation of the intermetatarsal angle of approximately 15 degrees left and right with elevation of the hallux interphalangeus angle left over right

## 2021-02-26 ENCOUNTER — Telehealth: Payer: Self-pay

## 2021-02-26 MED ORDER — ONDANSETRON HCL 4 MG PO TABS: 4.0000 mg | ORAL_TABLET | Freq: Three times a day (TID) | ORAL | 0 refills | Status: DC | PRN

## 2021-02-26 MED ORDER — OXYCODONE-ACETAMINOPHEN 10-325 MG PO TABS: 1.0000 | ORAL_TABLET | ORAL | 0 refills | Status: DC | PRN

## 2021-02-26 NOTE — Addendum Note (Signed)
Addended by: Lenn Sink on: 02/26/2021 09:05 PM   Modules accepted: Orders

## 2021-02-26 NOTE — Telephone Encounter (Signed)
DOS 02/27/2021  [facility] - [id number] - [place]  RECEIVED AUTH FAX FROM [facility]. CPT 28310 & [id number] APPROVED, Berkley Harvey #[id number], GOOD FOR 02/27/2021 ONLY

## 2021-02-27 ENCOUNTER — Encounter: Payer: Self-pay | Admitting: Podiatry

## 2021-02-27 DIAGNOSIS — M2012 Hallux valgus (acquired), left foot: Secondary | ICD-10-CM | POA: Diagnosis not present

## 2021-02-27 DIAGNOSIS — M2042 Other hammer toe(s) (acquired), left foot: Secondary | ICD-10-CM | POA: Diagnosis not present

## 2021-03-02 ENCOUNTER — Telehealth: Payer: Self-pay | Admitting: Podiatry

## 2021-03-02 NOTE — Telephone Encounter (Signed)
Patient wife called and stated that patient has been vomiting all morning/afternoon and will not stop. He took Percocet last night. Dr. Charlsie Merles did send nausea medication.

## 2021-03-03 ENCOUNTER — Other Ambulatory Visit: Payer: Self-pay | Admitting: Podiatry

## 2021-03-03 MED ORDER — ONDANSETRON HCL 8 MG PO TABS: 8.0000 mg | ORAL_TABLET | Freq: Three times a day (TID) | ORAL | 0 refills | Status: DC | PRN

## 2021-03-03 NOTE — Telephone Encounter (Signed)
done

## 2021-03-05 ENCOUNTER — Encounter: Payer: Self-pay | Admitting: Podiatry

## 2021-03-05 ENCOUNTER — Other Ambulatory Visit: Payer: Self-pay

## 2021-03-05 ENCOUNTER — Ambulatory Visit (INDEPENDENT_AMBULATORY_CARE_PROVIDER_SITE_OTHER): Payer: 59 | Admitting: Podiatry

## 2021-03-05 ENCOUNTER — Ambulatory Visit (INDEPENDENT_AMBULATORY_CARE_PROVIDER_SITE_OTHER): Payer: 59

## 2021-03-05 VITALS — Temp 99.4°F

## 2021-03-05 DIAGNOSIS — M21612 Bunion of left foot: Secondary | ICD-10-CM

## 2021-03-05 DIAGNOSIS — M21619 Bunion of unspecified foot: Secondary | ICD-10-CM

## 2021-03-08 ENCOUNTER — Encounter: Payer: Self-pay | Admitting: Podiatry

## 2021-03-08 ENCOUNTER — Other Ambulatory Visit: Payer: Self-pay | Admitting: Podiatry

## 2021-03-08 MED ORDER — DOXYCYCLINE HYCLATE 100 MG PO TABS: 100.0000 mg | ORAL_TABLET | Freq: Two times a day (BID) | ORAL | 0 refills | Status: DC

## 2021-03-08 NOTE — Telephone Encounter (Signed)
Sent in antibiotic 

## 2021-03-09 NOTE — Progress Notes (Signed)
Subjective:   Patient ID: Terry Woodward, male   DOB: 45 y.o.   MRN: [id number]   HPI Patient presents stating he is doing quite a bit better states that he is having minimal discomfort very pleased and able to walk on his foot   ROS      Objective:  Physical Exam  Neurovascular status intact negative [name]' sign noted wound edges well coapted hallux rectus position     Assessment:  Doing well post Minta Balsam left     Plan:  H&P reviewed condition and reapplied sterile dressing.  Continue elevation immobilization and reappoint to recheck  X-rays indicate that there is good healing of the osteotomy good healing of the digit good alignment noted.  There is slight redness around the incision as precautionary measure I did place him on antibiotics for the next 10 days and gave him strict instructions to call us if any changes were to occur and we see back in the next 2 weeks

## 2021-03-23 ENCOUNTER — Encounter: Payer: Self-pay | Admitting: Podiatry

## 2021-03-23 ENCOUNTER — Other Ambulatory Visit: Payer: Self-pay

## 2021-03-23 ENCOUNTER — Ambulatory Visit (INDEPENDENT_AMBULATORY_CARE_PROVIDER_SITE_OTHER): Payer: 59 | Admitting: Podiatry

## 2021-03-23 ENCOUNTER — Ambulatory Visit (INDEPENDENT_AMBULATORY_CARE_PROVIDER_SITE_OTHER): Payer: 59

## 2021-03-23 DIAGNOSIS — M21619 Bunion of unspecified foot: Secondary | ICD-10-CM

## 2021-03-23 DIAGNOSIS — M21612 Bunion of left foot: Secondary | ICD-10-CM | POA: Diagnosis not present

## 2021-03-23 MED ORDER — DOXYCYCLINE HYCLATE 100 MG PO TABS: 100.0000 mg | ORAL_TABLET | Freq: Two times a day (BID) | ORAL | 0 refills | Status: DC
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
## 2021-04-02 ENCOUNTER — Ambulatory Visit (INDEPENDENT_AMBULATORY_CARE_PROVIDER_SITE_OTHER): Payer: 59 | Admitting: Podiatry

## 2021-04-02 ENCOUNTER — Ambulatory Visit (INDEPENDENT_AMBULATORY_CARE_PROVIDER_SITE_OTHER): Payer: 59

## 2021-04-02 ENCOUNTER — Other Ambulatory Visit: Payer: Self-pay

## 2021-04-02 DIAGNOSIS — M21619 Bunion of unspecified foot: Secondary | ICD-10-CM

## 2021-04-02 DIAGNOSIS — M21612 Bunion of left foot: Secondary | ICD-10-CM

## 2021-04-02 MED ORDER — DOXYCYCLINE HYCLATE 100 MG PO TABS: 100.0000 mg | ORAL_TABLET | Freq: Two times a day (BID) | ORAL | 0 refills | Status: DC

## 2021-04-02 NOTE — Progress Notes (Signed)
Subjective:   Patient ID: Terry Woodward, male   DOB: 45 y.o.   MRN: [id number]   HPI Patient states she is doing a lot better but still noted a little bit of drainage and states it does not seem as red as it was and he is trying to be more active   ROS      Objective:  Physical Exam  Neurovascular status intact with patient who does have moderate obesity and a very wide foot red structural bunion correction which is done very well with mild redness around the incision but does get some irritation between the hallux and second toe that appears to be better but still slight drainage noted.  Patient has good digital perfusion well oriented     Assessment:  Doing well post forefoot reconstruction left with possibility for low-grade localized cellulitis no proximal spread no indication systemic that is improving but still present     Plan:  H&P reviewed condition and due to the fact there is still slight redness left I went ahead as precaution measure we will redo the doxycycline for another 10 days.  Continue the soaks and drying the area and a small amount of antibiotic on the small fissure but it should heal uneventfully and patient will be seen back 3 weeks or earlier if any further redness or any systemic signs of infection were to occur  X-rays indicate osteotomies are healing well good alignment noted excellent correction underlying deformity no sign of bone osteolysis

## 2021-04-23 ENCOUNTER — Other Ambulatory Visit: Payer: Self-pay

## 2021-04-23 ENCOUNTER — Ambulatory Visit (INDEPENDENT_AMBULATORY_CARE_PROVIDER_SITE_OTHER): Payer: 59

## 2021-04-23 ENCOUNTER — Encounter: Payer: Self-pay | Admitting: Podiatry

## 2021-04-23 ENCOUNTER — Ambulatory Visit (INDEPENDENT_AMBULATORY_CARE_PROVIDER_SITE_OTHER): Payer: 59 | Admitting: Podiatry

## 2021-04-23 DIAGNOSIS — M21612 Bunion of left foot: Secondary | ICD-10-CM | POA: Diagnosis not present

## 2021-04-23 DIAGNOSIS — M21619 Bunion of unspecified foot: Secondary | ICD-10-CM

## 2021-04-23 NOTE — Progress Notes (Signed)
d 

## 2021-04-24 NOTE — Progress Notes (Signed)
Subjective:   Patient ID: Terry Woodward, male   DOB: 45 y.o.   MRN: [id number]   HPI Patient presents stating doing well with my left foot very pleased gradual return to soft shoe gear   ROS      Objective:  Physical Exam  Neurovascular status intact negative [name]' sign noted wound edges well coapted left no redness no drainage and good alignment good range of motion     Assessment:  Doing well post Minta Balsam osteotomy left     Plan:  H&P reviewed condition recommended the continuation of range of motion exercises gradual increase in activities and reviewed return to work.  Reappoint to recheck 6 weeks  X-rays indicate osteotomies healing well fixation in place good alignment

## 2022-07-19 ENCOUNTER — Ambulatory Visit (INDEPENDENT_AMBULATORY_CARE_PROVIDER_SITE_OTHER): Payer: No Typology Code available for payment source | Admitting: Podiatry

## 2022-07-19 ENCOUNTER — Encounter: Payer: Self-pay | Admitting: Podiatry

## 2022-07-19 DIAGNOSIS — L600 Ingrowing nail: Secondary | ICD-10-CM | POA: Diagnosis not present

## 2022-07-19 NOTE — Patient Instructions (Addendum)

## 2022-07-20 NOTE — Progress Notes (Signed)
Subjective:   Patient ID: Terry Woodward, male   DOB: 46 y.o.   MRN: [id number]   HPI Patient states the left great toe has been ingrown and painful for the last couple weeks.  There was pus and drainage but that has reduced now and now it is just more the structure of the nailbed.  Doing well with bunion correction   ROS      Objective:  Physical Exam  Neurovascular status intact with patient found to have incurvated medial border left hallux that is moderately painful with no erythema edema drainage noted currently     Assessment:  Ingrown toenail deformity left hallux medial border     Plan:  H&P reviewed condition recommended correction of deformity explained procedure risk and patient wants surgery.  Signed consent form and today I infiltrated the left hallux 60 mg like Marcaine mixture sterile prep done and using sterile instrumentation remove the medial border exposed matrix applied phenol 3 applications 30 seconds followed by alcohol lavage sterile dressing gave instructions on soaks and to leave dressing on 24 hours but take it off earlier if throbbing were to occur.  Encouraged to call with questions concerns

## 2023-09-24 NOTE — Progress Notes (Signed)
[facility] [address] [place] [address]   Clinic Day:  09/24/2023  Referring physician: Benita Stabile, MD  Patient Care Team: Benita Stabile, MD as PCP - General (Internal Medicine)   ASSESSMENT & PLAN:   Assessment: ***  Plan: ***  No orders of the defined types were placed in this encounter.     Alben Deeds Teague,acting as a Neurosurgeon for Doreatha Massed, MD.,have documented all relevant documentation on the behalf of Doreatha Massed, MD,as directed by  Doreatha Massed, MD while in the presence of Doreatha Massed, MD.   ***  Lissa Hoard R Teague   10/9/20249:59 PM  CHIEF COMPLAINT/PURPOSE OF CONSULT:   Diagnosis: ***  Current Therapy:  ***  HISTORY OF PRESENT ILLNESS:   Terry Woodward is a 47 y.o. male presenting to clinic today for evaluation of erythrocytosis at the request of Bryon Lions, [place].  Today, he states that he is doing well overall. His appetite level is at ***%. His energy level is at ***%.  He was found to have abnormal CBC from 04/14/23 with elevated RBC at 6.77, elevated HGB at 17.5, elevated HCT at 54.1, low MCH at 25.8, and elevated RDW at 16.4. Of note, he is on testosterone therapy and would like to have therapeutic phlebotomy in order to donate blood.   ***He denies recent chest pain on exertion, shortness of breath on minimal exertion, pre-syncopal episodes, or palpitations. ***He had not noticed any recent bleeding such as epistaxis, hematuria or hematochezia ***The patient denies over the counter NSAID ingestion. He is not *** on antiplatelets agents. His last colonoscopy was *** ***He had no prior history or diagnosis of cancer. He denies any family history of cancer.  *** His age appropriate screening programs are up-to-date. ***He denies any pica and eats a variety of diet. ***He never donated blood or received blood transfusion. 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   WISDOM TOOTH EXTRACTION      Social History: Social History   Socioeconomic History   Marital status: Married    Spouse name: Not on file   Number of children: Not on file   Years of education: Not on file   Highest education level: Not on file  Occupational History   Occupation: Scientist, research (medical): [employer]  Tobacco Use   Smoking status: Never   Smokeless tobacco: Not on file  Substance and Sexual Activity   Alcohol use: Yes    Comment: 1-2 beers a month   Drug use: No   Sexual activity: Not on file  Other Topics Concern   Not on file  Social History Narrative   Not on file   Social Determinants of Health   Financial Resource Strain: Not on file  Food Insecurity: Not on file  Transportation Needs: Not on file  Physical Activity: Not on file  Stress: Not on file  Social Connections: Not on file  Intimate  Partner Violence: Not on file    Family History: Family History  Problem Relation Age of Onset   Colon cancer Maternal Grandmother    Colon cancer Maternal Grandfather    Colon cancer Paternal Grandmother    Ovarian cancer Mother     Current Medications:  Current Outpatient Medications:    albuterol (VENTOLIN HFA) 108 (90 Base) MCG/ACT inhaler, Inhale 2 puffs into the lungs every 6 (six) hours as needed., Disp: , Rfl:    doxycycline (VIBRA-TABS) 100 MG tablet, Take 1 tablet (100 mg total) by mouth 2 (two) times daily., Disp: 20 tablet, Rfl: 0   doxycycline (VIBRA-TABS) 100 MG tablet, Take 1 tablet  (100 mg total) by mouth 2 (two) times daily., Disp: 20 tablet, Rfl: 0   doxycycline (VIBRA-TABS) 100 MG tablet, Take 1 tablet (100 mg total) by mouth 2 (two) times daily., Disp: 20 tablet, Rfl: 0   ibuprofen (ADVIL) 600 MG tablet, Take by mouth., Disp: , Rfl:    ibuprofen (ADVIL,MOTRIN) 200 MG tablet, Take 600 mg by mouth every 6 (six) hours as needed for pain., Disp: , Rfl:    ondansetron (ZOFRAN) 4 MG tablet, Take 1 tablet (4 mg total) by mouth every 8 (eight) hours as needed for nausea or vomiting., Disp: 20 tablet, Rfl: 0   ondansetron (ZOFRAN) 8 MG tablet, Take 1 tablet (8 mg total) by mouth every 8 (eight) hours as needed for nausea or vomiting. Take 1 hour prior to taking pain medication, Disp: 20 tablet, Rfl: 0   oxyCODONE-acetaminophen (PERCOCET) 10-325 MG tablet, Take 1 tablet by mouth every 4 (four) hours as needed for pain., Disp: 25 tablet, Rfl: 0   testosterone cypionate (DEPOTESTOSTERONE CYPIONATE) 200 MG/ML injection, Inject into the muscle., Disp: , Rfl:    Allergies: Allergies  Allergen Reactions   Penicillins Other (See Comments)    Does not know     REVIEW OF SYSTEMS:   Review of Systems  Constitutional:  Negative for chills, fatigue and fever.  HENT:   Negative for lump/mass, mouth sores, nosebleeds, sore throat and trouble swallowing.   Eyes:  Negative for eye problems.  Respiratory:  Negative for cough and shortness of breath.   Cardiovascular:  Negative for chest pain, leg swelling and palpitations.  Gastrointestinal:  Negative for abdominal pain, constipation, diarrhea, nausea and vomiting.  Genitourinary:  Negative for bladder incontinence, difficulty urinating, dysuria, frequency, hematuria and nocturia.   Musculoskeletal:  Negative for arthralgias, back pain, flank pain, myalgias and neck pain.  Skin:  Negative for itching and rash.  Neurological:  Negative for dizziness, headaches and numbness.  Hematological:  Does not bruise/bleed easily.   Psychiatric/Behavioral:  Negative for depression, sleep disturbance and suicidal ideas. The patient is not nervous/anxious.   All other systems reviewed and are negative.    VITALS:   There were no vitals taken for this visit.  Wt Readings from Last 3 Encounters:  10/29/13 297 lb (134.7 kg)  10/06/13 297 lb 9.6 oz (135 kg)    There is no height or weight on file to calculate BMI.   PHYSICAL EXAM:   Physical Exam Vitals and nursing note reviewed. Exam conducted with a chaperone present.  Constitutional:      Appearance: Normal appearance.  Cardiovascular:     Rate and Rhythm: Normal rate and regular rhythm.     Pulses: Normal pulses.     Heart sounds: Normal heart sounds.  Pulmonary:     Effort: Pulmonary effort is normal.  Breath sounds: Normal breath sounds.  Abdominal:     Palpations: Abdomen is soft. There is no hepatomegaly, splenomegaly or mass.     Tenderness: There is no abdominal tenderness.  Musculoskeletal:     Right lower leg: No edema.     Left lower leg: No edema.  Lymphadenopathy:     Cervical: No cervical adenopathy.     Right cervical: No superficial, deep or posterior cervical adenopathy.    Left cervical: No superficial, deep or posterior cervical adenopathy.     Upper Body:     Right upper body: No supraclavicular or axillary adenopathy.     Left upper body: No supraclavicular or axillary adenopathy.  Neurological:     General: No focal deficit present.     Mental Status: He is alert and oriented to person, place, and time.  Psychiatric:        Mood and Affect: Mood normal.        Behavior: Behavior normal.     LABS:      Latest Ref Rng & Units 02/24/2013   12:00 AM  CBC  Hemoglobin g/dL 16.1      Hematocrit % 44         This result is from an external source.      Latest Ref Rng & Units 02/24/2013   12:00 AM  CMP  Total Bilirubin mg/dL 0.8      Alkaline Phos U/L 92.0      AST U/L 22      ALT 10 - 40 U/L 31         This result  is from an external source.     No results found for: "CEA1", "CEA" / No results found for: "CEA1", "CEA" No results found for: "PSA1" No results found for: "WRU045" No results found for: "CAN125"  No results found for: "TOTALPROTELP", "ALBUMINELP", "A1GS", "A2GS", "BETS", "BETA2SER", "GAMS", "MSPIKE", "SPEI" No results found for: "TIBC", "FERRITIN", "IRONPCTSAT" No results found for: "LDH"   STUDIES:   No results found.

## 2023-09-25 ENCOUNTER — Inpatient Hospital Stay: Payer: No Typology Code available for payment source

## 2023-09-25 ENCOUNTER — Inpatient Hospital Stay: Payer: No Typology Code available for payment source | Attending: Hematology | Admitting: Hematology

## 2023-09-25 VITALS — BP 143/85 | HR 82 | Temp 98.0°F | Resp 18 | Ht 73.0 in | Wt 298.8 lb

## 2023-09-25 DIAGNOSIS — D751 Secondary polycythemia: Secondary | ICD-10-CM | POA: Diagnosis present

## 2023-09-25 DIAGNOSIS — R634 Abnormal weight loss: Secondary | ICD-10-CM | POA: Diagnosis not present

## 2023-09-25 LAB — CBC WITH DIFFERENTIAL/PLATELET
Abs Immature Granulocytes: 0.05 10*3/uL (ref 0.00–0.07)
Basophils Absolute: 0.1 10*3/uL (ref 0.0–0.1)
Basophils Relative: 1 %
Eosinophils Absolute: 0.3 10*3/uL (ref 0.0–0.5)
Eosinophils Relative: 3 %
HCT: 56.3 % — ABNORMAL HIGH (ref 39.0–52.0)
Hemoglobin: 18.5 g/dL — ABNORMAL HIGH (ref 13.0–17.0)
Immature Granulocytes: 0 %
Lymphocytes Relative: 25 %
Lymphs Abs: 2.9 10*3/uL (ref 0.7–4.0)
MCH: 26.5 pg (ref 26.0–34.0)
MCHC: 32.9 g/dL (ref 30.0–36.0)
MCV: 80.5 fL (ref 80.0–100.0)
Monocytes Absolute: 0.8 10*3/uL (ref 0.1–1.0)
Monocytes Relative: 7 %
Neutro Abs: 7.3 10*3/uL (ref 1.7–7.7)
Neutrophils Relative %: 64 %
Platelets: 293 10*3/uL (ref 150–400)
RBC: 6.99 MIL/uL — ABNORMAL HIGH (ref 4.22–5.81)
RDW: 16 % — ABNORMAL HIGH (ref 11.5–15.5)
WBC: 11.5 10*3/uL — ABNORMAL HIGH (ref 4.0–10.5)
nRBC: 0 % (ref 0.0–0.2)

## 2023-09-25 LAB — LACTATE DEHYDROGENASE: LDH: 121 U/L (ref 98–192)

## 2023-09-25 NOTE — Patient Instructions (Signed)
[facility]  Discharge Instructions  You were seen and examined today by Dr. Ellin Saba. Dr. Ellin Saba is a hematologist, meaning that he specializes in blood abnormalities. Dr. Ellin Saba discussed your past medical history, family history of cancers/blood conditions and the events that led to you being here today.  You were referred to Dr. Ellin Saba due to Polycythemia.  Dr. Ellin Saba has recommended additional labs today for further evaluation.  Follow-up as scheduled in 8 weeks.    Thank you for choosing [facility] to provide your oncology and hematology care.   To afford each patient quality time with our provider, please arrive at least 15 minutes before your scheduled appointment time. You may need to reschedule your appointment if you arrive late (10 or more minutes). Arriving late affects you and other patients whose appointments are after yours.  Also, if you miss three or more appointments without notifying the office, you may be dismissed from the clinic at the provider's discretion.    Again, thank you for choosing [facility].  Our hope is that these requests will decrease the amount of time that you wait before being seen by our physicians.   If you have a lab appointment with the Cancer Center - please note that after April 8th, all labs will be drawn in the cancer center.  You do not have to check in or register with the main entrance as you have in the past but will complete your check-in at the cancer center.            _____________________________________________________________  Should you have questions after your visit to [facility], please contact our office at [phone] and follow the prompts.  Our office hours are 8:00 a.m. to 4:30 p.m. Monday - Thursday and 8:00 a.m. to 2:30 p.m. Friday.  Please note that voicemails left after 4:00 p.m. may not be returned until the following  business day.  We are closed weekends and all major holidays.  You do have access to a nurse 24-7, just call the main number to the clinic [phone] and do not press any options, hold on the line and a nurse will answer the phone.    For prescription refill requests, have your pharmacy contact our office and allow 72 hours.    Masks are no longer required in the cancer centers. If you would like for your care team to wear a mask while they are taking care of you, please let them know. You may have one support person who is at least 48 years old accompany you for your appointments.

## 2023-09-26 LAB — ERYTHROPOIETIN: Erythropoietin: 13.4 m[IU]/mL (ref 2.6–18.5)

## 2023-10-02 LAB — JAK2 V617F RFX CALR/MPL/E12-15

## 2023-10-02 LAB — CALR +MPL + E12-E15  (REFLEX)

## 2023-11-24 NOTE — Progress Notes (Unsigned)
[facility] [address], [place] [address]   CLINIC:  Medical Oncology/Hematology  PCP:  Benita Stabile, MD [address] [place] [address] [phone]   REASON FOR VISIT:  Follow-up for erythrocytosis  CURRENT THERAPY: Intermittent phlebotomy  INTERVAL HISTORY:   Mr. Terry Woodward 47 y.o. male returns for routine follow-up of erythrocytosis.  He was last seen by Dr. Ellin Saba on 09/25/2023.  At today's visit, he reports feeling fairly well.  No recent hospitalizations, surgeries, or changes in baseline health status.  He denies any history of DVT, PE, MI, or CVA.  No history of cardiac risk factors such as hypertension, hyperlipidemia, or diabetes.  He denies any fatigue, severe recurrent headaches, aquagenic pruritus, erythromelalgia, or other vasomotor symptoms.  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  No B symptoms.  He has 100% energy and 100% appetite. He endorses that he is maintaining a stable weight.  ASSESSMENT & PLAN:  1.   Erythrocytosis: - He is on testosterone injections 0.75 mL every 10 days for the past 1 year. - He has been donating blood at [facility], last donation about 3 months ago.  He could not donate anymore due to difficulty finding veins. - Reports 45 pound weight loss since testosterone injections started.   - Denies history of sleep apnea.  Non-smoker.  No carbon monoxide exposure. - No history of DVT, PE, MI, CVA.  No cardiac risk factors such as hypertension, hyperlipidemia, or diabetes. - Hematology workup (09/25/2023): JAK2 V617F with reflex to CALR, MPL, Exons 12-15 was NEGATIVE Normal erythropoietin 13.4.  Normal LDH. 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  No evidence of clonal MPN at this time. - PLAN: Discussed that treatment of secondary erythrocytosis is use of  therapeutic phlebotomy in cases of significant symptoms (dizziness, severe recurrent headaches, strokelike symptoms, or severe fatigue), or if hematocrit is >54. - Proceed with phlebotomy today.  Recommend CBC + phlebotomy (if HCT >52.0) every 2 months. - No indication for aspirin in the absence of other cardiac risk factors.  2.  Social/Family History:  - Works as a Emergency planning/management officer for a [employer]. - No chemical exposure. - No tobacco use.  - No family history of polycythemia. - Mother had ovarian and breast cancer. Father had DVT. Maternal grandparents and paternal grandmother had colon cancer. Maternal cousin had lung cancer.   PLAN SUMMARY: >> Phlebotomy today >> CBC + possible phlebotomy (if HCT >52.0) every 2 months >> OFFICE visit in 6 months (same day as labs and phlebotomy)     REVIEW OF SYSTEMS:   Review of Systems  Constitutional:  Negative for appetite change, chills, diaphoresis, fever and unexpected weight change.  HENT:   Positive for tinnitus (Chronic). Negative for lump/mass and nosebleeds.   Eyes:  Negative for eye problems.  Respiratory:  Negative for cough, hemoptysis and shortness of breath.   Cardiovascular:  Negative for chest pain, leg swelling and palpitations.  Gastrointestinal:  Negative for abdominal pain, blood in stool, constipation, diarrhea, nausea and vomiting.  Genitourinary:  Negative for hematuria.   Skin: Negative.   Neurological:  Negative for dizziness, headaches and light-headedness.  Hematological:  Does not bruise/bleed easily.     PHYSICAL EXAM:  ECOG PERFORMANCE STATUS: 0 - Asymptomatic  Vitals:   11/25/23 1251  BP: 136/80  Pulse: 75  Resp: 17  Temp: 97.8  F (36.6 C)  SpO2: 98%   Filed Weights   11/25/23 1251  Weight: (!) 300 lb 4.3 oz (136.2 kg)   Physical Exam Constitutional:      Appearance: Normal appearance. He is obese.  Cardiovascular:     Heart sounds: Normal heart sounds.  Pulmonary:     Breath  sounds: Normal breath sounds.  Neurological:     General: No focal deficit present.     Mental Status: Mental status is at baseline.  Psychiatric:        Behavior: Behavior normal. Behavior is cooperative.     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   WISDOM TOOTH EXTRACTION      SOCIAL HISTORY:  Social History   Socioeconomic History   Marital status: Married    Spouse name: Not on file   Number of children: Not on file   Years of education: Not on file   Highest education level: Not on file  Occupational History   Occupation: Scientist, research (medical): [employer]  Tobacco Use   Smoking status: Never   Smokeless tobacco: Not on file  Substance and Sexual Activity   Alcohol use: Yes    Comment: 1-2 beers a month   Drug use: No   Sexual activity: Not on file  Other Topics Concern   Not on file  Social History Narrative   Not on file   Social Determinants of Health   Financial Resource Strain: Not on file  Food Insecurity: Not on file  Transportation Needs: Not on file  Physical Activity: Not on file  Stress: Not on file  Social Connections: Not on file  Intimate Partner Violence: Not on file    FAMILY HISTORY:  Family History  Problem Relation Age of Onset   Colon cancer Maternal Grandmother    Colon cancer Maternal Grandfather    Colon cancer Paternal Grandmother    Ovarian cancer Mother     CURRENT MEDICATIONS:  Outpatient Encounter Medications as of 11/25/2023  Medication Sig   B-D 3CC LUER-LOK SYR 23GX1" 23G X 1" 3 ML MISC FOR USE WITH TESTOSTERONE WEEKLY TO  EVERY 2 WEEKS 30 DAYS   meloxicam (MOBIC) 15 MG tablet Take 15 mg by mouth daily as needed.   testosterone cypionate (DEPOTESTOSTERONE CYPIONATE) 200 MG/ML injection Inject into the muscle.   No facility-administered encounter medications on file as of 11/25/2023.    ALLERGIES:  Allergies  Allergen Reactions   Penicillins Other (See Comments)    Does not know     LABORATORY DATA:  I have reviewed the labs as listed.  CBC    Component Value Date/Time   WBC 11.0 (H) 11/25/2023 1149   RBC 6.52 (H) 11/25/2023 1149   HGB 17.9 (H) 11/25/2023 1149   HGB 15.3 02/24/2013 0000   HCT 54.4 (H) 11/25/2023 1149   HCT 44 02/24/2013 0000   PLT 310 11/25/2023 1149   MCV 83.4 11/25/2023 1149   MCH 27.5 11/25/2023 1149   MCHC 32.9 11/25/2023 1149   RDW 15.4 11/25/2023 1149  LYMPHSABS 2.9 11/25/2023 1149   MONOABS 0.8 11/25/2023 1149   EOSABS 0.4 11/25/2023 1149   BASOSABS 0.1 11/25/2023 1149      Latest Ref Rng & Units 02/24/2013   12:00 AM  CMP  Total Bilirubin mg/dL 0.8      Alkaline Phos U/L 92.0      AST U/L 22      ALT 10 - 40 U/L 31         This result is from an external source.    DIAGNOSTIC IMAGING:  I have independently reviewed the relevant imaging and discussed with the patient.   WRAP UP:  All questions were answered. The patient knows to call the clinic with any problems, questions or concerns.  Medical decision making: Low  Time spent on visit: I spent 15 minutes counseling the patient face to face. The total time spent in the appointment was 22 minutes and more than 50% was on counseling.  Carnella Guadalajara, PA-C  11/25/23 1:50 PM

## 2023-11-25 ENCOUNTER — Inpatient Hospital Stay (HOSPITAL_BASED_OUTPATIENT_CLINIC_OR_DEPARTMENT_OTHER): Payer: No Typology Code available for payment source | Admitting: Physician Assistant

## 2023-11-25 ENCOUNTER — Inpatient Hospital Stay: Payer: No Typology Code available for payment source | Attending: Hematology

## 2023-11-25 ENCOUNTER — Inpatient Hospital Stay: Payer: No Typology Code available for payment source

## 2023-11-25 ENCOUNTER — Other Ambulatory Visit: Payer: Self-pay | Admitting: Physician Assistant

## 2023-11-25 VITALS — BP 136/80 | HR 75 | Temp 97.8°F | Resp 17 | Ht 73.0 in | Wt 300.3 lb

## 2023-11-25 VITALS — BP 141/95 | HR 67 | Temp 97.7°F | Resp 18

## 2023-11-25 DIAGNOSIS — D751 Secondary polycythemia: Secondary | ICD-10-CM

## 2023-11-25 LAB — CBC WITH DIFFERENTIAL/PLATELET
Abs Immature Granulocytes: 0.05 10*3/uL (ref 0.00–0.07)
Basophils Absolute: 0.1 10*3/uL (ref 0.0–0.1)
Basophils Relative: 1 %
Eosinophils Absolute: 0.4 10*3/uL (ref 0.0–0.5)
Eosinophils Relative: 4 %
HCT: 54.4 % — ABNORMAL HIGH (ref 39.0–52.0)
Hemoglobin: 17.9 g/dL — ABNORMAL HIGH (ref 13.0–17.0)
Immature Granulocytes: 1 %
Lymphocytes Relative: 27 %
Lymphs Abs: 2.9 10*3/uL (ref 0.7–4.0)
MCH: 27.5 pg (ref 26.0–34.0)
MCHC: 32.9 g/dL (ref 30.0–36.0)
MCV: 83.4 fL (ref 80.0–100.0)
Monocytes Absolute: 0.8 10*3/uL (ref 0.1–1.0)
Monocytes Relative: 7 %
Neutro Abs: 6.6 10*3/uL (ref 1.7–7.7)
Neutrophils Relative %: 60 %
Platelets: 310 10*3/uL (ref 150–400)
RBC: 6.52 MIL/uL — ABNORMAL HIGH (ref 4.22–5.81)
RDW: 15.4 % (ref 11.5–15.5)
WBC: 11 10*3/uL — ABNORMAL HIGH (ref 4.0–10.5)
nRBC: 0 % (ref 0.0–0.2)

## 2023-11-25 NOTE — Patient Instructions (Signed)
[facility] [place] - A DEPT OF [facility]  Discharge Instructions: Thank you for choosing [facility] to provide your oncology and hematology care.  If you have a lab appointment with the Cancer Center - please note that after April 8th, 2024, all labs will be drawn in the cancer center.  You do not have to check in or register with the main entrance as you have in the past but will complete your check-in in the cancer center.  Wear comfortable clothing and clothing appropriate for easy access to any Portacath or PICC line.   We strive to give you quality time with your provider. You may need to reschedule your appointment if you arrive late (15 or more minutes).  Arriving late affects you and other patients whose appointments are after yours.  Also, if you miss three or more appointments without notifying the office, you may be dismissed from the clinic at the provider's discretion.      For prescription refill requests, have your pharmacy contact our office and allow 72 hours for refills to be completed.    Today you received the following chemotherapy and/or immunotherapy agents phlebotomy. Therapeutic Phlebotomy, Care After The following information offers guidance on how to care for yourself after your procedure. Your health care provider may also give you more specific instructions. If you have problems or questions, contact your health care provider. What can I expect after the procedure? After therapeutic phlebotomy, it is common to have: Light-headedness or dizziness. You may feel faint. Nausea. Tiredness (fatigue). Follow these instructions at home: Eating and drinking Be sure to eat well-balanced meals for the next 24 hours. Drink enough fluid to keep your urine pale yellow. Avoid drinking alcohol on the day that you had the procedure. Activity  Return to your normal activities as told by your health care provider. Most people can go back  to their normal activities right away. Avoid activities that take a lot of effort for about 5 hours after the procedure. Athletes should avoid strenuous exercise for at least 12 hours. Avoid heavy lifting or pulling for about 5 hours after the procedure. Do not lift anything that is heavier than 10 lb (4.5 kg). Change positions slowly for the remainder of the day, like from sitting to standing. This can help prevent light-headedness or fainting. If you feel light-headed, lie down until the feeling goes away. Needle insertion site care  Keep your bandage (dressing) dry. You can remove the bandage after about 5 hours or as told by your health care provider. If you have bleeding from the needle insertion site, raise (elevate) your arm and press firmly on the site until the bleeding stops. If you have bruising at the site, apply ice to the area. To do this: Put ice in a plastic bag. Place a towel between your skin and the bag. Leave the ice on for 20 minutes, 2-3 times a day for the first 24 hours. Remove the ice if your skin turns bright red so you do not damage the area. If the swelling does not go away after 24 hours, apply a warm, moist cloth (warm compress) to the area for 20 minutes, 2-3 times a day. General instructions Do not use any products that contain nicotine or tobacco, like cigarettes, chewing tobacco, and vaping devices, such as e-cigarettes, for at least 30 minutes after the procedure. If you need help quitting, ask your health care provider. Keep all follow-up visits.  You may need to continue having regular blood tests and therapeutic phlebotomy treatments as directed. Contact a health care provider if: You have redness, swelling, or pain at the needle insertion site. Fluid or blood is coming from the needle insertion site. Pus or a bad smell is coming from the needle insertion site. The needle insertion site feels warm to the touch. You feel light-headed, dizzy, or nauseous, and  the feeling does not go away. You have new bruising at the needle insertion site. You feel weaker than normal. You have a fever or chills. Get help right away if: You have chest pain. You have trouble breathing. You have severe nausea or vomiting. Summary After the procedure, it is common to have some light-headedness, dizziness, nausea, or tiredness (fatigue). Be sure to eat well-balanced meals for the next 24 hours. Drink enough fluid to keep your urine pale yellow. Return to your normal activities as told by your health care provider. Keep all follow-up visits. You may need to continue having regular blood tests and therapeutic phlebotomy treatments as directed. This information is not intended to replace advice given to you by your health care provider. Make sure you discuss any questions you have with your health care provider. Document Revised: 05/30/2021 Document Reviewed: 05/30/2021 Elsevier Patient Education  2024 [employer]       To help prevent nausea and vomiting after your treatment, we encourage you to take your nausea medication as directed.  BELOW ARE SYMPTOMS THAT SHOULD BE REPORTED IMMEDIATELY: *FEVER GREATER THAN 100.4 F (38 C) OR HIGHER *CHILLS OR SWEATING *NAUSEA AND VOMITING THAT IS NOT CONTROLLED WITH YOUR NAUSEA MEDICATION *UNUSUAL SHORTNESS OF BREATH *UNUSUAL BRUISING OR BLEEDING *URINARY PROBLEMS (pain or burning when urinating, or frequent urination) *BOWEL PROBLEMS (unusual diarrhea, constipation, pain near the anus) TENDERNESS IN MOUTH AND THROAT WITH OR WITHOUT PRESENCE OF ULCERS (sore throat, sores in mouth, or a toothache) UNUSUAL RASH, SWELLING OR PAIN  UNUSUAL VAGINAL DISCHARGE OR ITCHING   Items with * indicate a potential emergency and should be followed up as soon as possible or go to the Emergency Department if any problems should occur.  Please show the CHEMOTHERAPY ALERT CARD or IMMUNOTHERAPY ALERT CARD at check-in to the Emergency  Department and triage nurse.  Should you have questions after your visit or need to cancel or reschedule your appointment, please contact [facility] [place] - A DEPT OF Eligha Bridegroom [facility] [phone]  and follow the prompts.  Office hours are 8:00 a.m. to 4:30 p.m. Monday - Friday. Please note that voicemails left after 4:00 p.m. may not be returned until the following business day.  We are closed weekends and major holidays. You have access to a nurse at all times for urgent questions. Please call the main number to the clinic [phone] and follow the prompts.  For any non-urgent questions, you may also contact your provider using MyChart. We now offer e-Visits for anyone 78 and older to request care online for non-urgent symptoms. For details visit [email or web address].   Also download the MyChart app! Go to the app store, search "MyChart", open the app, select [place], and log in with your MyChart username and password.

## 2023-11-25 NOTE — Progress Notes (Signed)
Terry Woodward presents for therapeutic phlebotomy per MD orders. Last HGB 19.9 / HCT 54.4 on 11-25-2023 . Vital signs stable prior to procedure. Procedure started at 14:06 pm and ended at 14:13 pm. 500 mls of blood removed. Patient denies any dizziness , lightheadedness, or feeling faint.  Gauze and coban applied to site. Vital signs stable at completion of procedure. Patient has no complaints at this time. Alert and oriented x 3. Discharged in stable condition.

## 2023-11-25 NOTE — Patient Instructions (Signed)
[facility] at [facility] **VISIT SUMMARY & IMPORTANT INSTRUCTIONS **   You were seen today by Rojelio Brenner PA-C for your follow-up visit.    ELEVATED HEMOGLOBIN & HEMATOCRIT Your labs did not show any evidence of cancerous cause of elevated red blood cells. Your elevated red blood cells are related to your testosterone injections. The biggest risk from elevated red blood cells is potential blood clot, heart attack, and stroke. You can decrease your risk of adverse events by completing therapeutic phlebotomy ("therapeutic blood donation") every 2 months.  We will schedule this to be done here at the [facility]. Will recheck labs and see you for follow-up visit in 6 months.  FOLLOW-UP APPOINTMENT: 17-months  ** Thank you for trusting me with your healthcare!  I strive to provide all of my patients with quality care at each visit.  If you receive a survey for this visit, I would be so grateful to you for taking the time to provide feedback.  Thank you in advance!  ~ Valbona Slabach                   Dr. Doreatha Massed   &   Rojelio Brenner, PA-C   - - - - - - - - - - - - - - - - - -    Thank you for choosing [facility] at [facility] to provide your oncology and hematology care.  To afford each patient quality time with our provider, please arrive at least 15 minutes before your scheduled appointment time.   If you have a lab appointment with the Cancer Center please come in thru the Main Entrance and check in at the main information desk.  You need to re-schedule your appointment should you arrive 10 or more minutes late.  We strive to give you quality time with our providers, and arriving late affects you and other patients whose appointments are after yours.  Also, if you no show three or more times for appointments you may be dismissed from the clinic at the providers discretion.     Again, thank you for choosing [facility].  Our hope is that these requests will decrease the amount of time that you wait before being seen by our physicians.       _____________________________________________________________  Should you have questions after your visit to [facility], please contact our office at [phone] and follow the prompts.  Our office hours are 8:00 a.m. and 4:30 p.m. Monday - Friday.  Please note that voicemails left after 4:00 p.m. may not be returned until the following business day.  We are closed weekends and major holidays.  You do have access to a nurse 24-7, just call the main number to the clinic [phone] and do not press any options, hold on the line and a nurse will answer the phone.    For prescription refill requests, have your pharmacy contact our office and allow 72 hours.

## 2024-01-26 ENCOUNTER — Inpatient Hospital Stay: Payer: No Typology Code available for payment source

## 2024-01-26 ENCOUNTER — Inpatient Hospital Stay: Payer: No Typology Code available for payment source | Attending: Hematology

## 2024-01-26 DIAGNOSIS — D751 Secondary polycythemia: Secondary | ICD-10-CM | POA: Diagnosis present

## 2024-01-26 LAB — CBC
HCT: 53.2 % — ABNORMAL HIGH (ref 39.0–52.0)
Hemoglobin: 17.5 g/dL — ABNORMAL HIGH (ref 13.0–17.0)
MCH: 28 pg (ref 26.0–34.0)
MCHC: 32.9 g/dL (ref 30.0–36.0)
MCV: 85 fL (ref 80.0–100.0)
Platelets: 238 10*3/uL (ref 150–400)
RBC: 6.26 MIL/uL — ABNORMAL HIGH (ref 4.22–5.81)
RDW: 14.6 % (ref 11.5–15.5)
WBC: 10.2 10*3/uL (ref 4.0–10.5)
nRBC: 0 % (ref 0.0–0.2)

## 2024-01-26 NOTE — Progress Notes (Signed)
 Vital signs reviewed with provider and ok to proceed with phlebotomy.  Patient instructed to follow up with PCP with understanding verbalized.    Von Grumbling presents today for phlebotomy per MD orders. Phlebotomy procedure started at 0905 and ended at 0914. 500 cc removed. Patient tolerated procedure well. IV needle removed intact.  Patient tolerated phlebotomy with no complaints voiced.  Peripheral IV site intact with no bruising or swelling noted.  Denied SOB, chest pain, or dizziness.  Gauze with coban applied.  Discharged with VSS and no s/s of distress noted.

## 2024-01-26 NOTE — Patient Instructions (Signed)

## 2024-03-25 ENCOUNTER — Inpatient Hospital Stay: Payer: No Typology Code available for payment source

## 2024-05-25 ENCOUNTER — Inpatient Hospital Stay: Payer: No Typology Code available for payment source

## 2024-05-25 ENCOUNTER — Inpatient Hospital Stay: Payer: No Typology Code available for payment source | Admitting: Physician Assistant

## 2024-12-10 ENCOUNTER — Telehealth: Admitting: Family Medicine

## 2024-12-10 ENCOUNTER — Encounter: Payer: Self-pay | Admitting: Oncology

## 2024-12-10 DIAGNOSIS — R051 Acute cough: Secondary | ICD-10-CM

## 2024-12-10 MED ORDER — BENZONATATE 100 MG PO CAPS: 100.0000 mg | ORAL_CAPSULE | Freq: Three times a day (TID) | ORAL | 0 refills | Status: AC | PRN

## 2024-12-10 NOTE — Progress Notes (Signed)
 We are sorry that you are not feeling well.  Here is how we plan to help!  Based on your presentation I believe you most likely have A cough due to a virus.  This is called viral bronchitis and is best treated by rest, plenty of fluids and control of the cough.  You may use Ibuprofen  or Tylenol  as directed to help your symptoms.     In addition you may use A non-prescription cough medication called Mucinex DM: take 2 tablets every 12 hours. and A prescription cough medication called Tessalon  Perles 100mg . You may take 1-2 capsules every 8 hours as needed for your cough.   From your responses in the eVisit questionnaire you describe inflammation in the upper respiratory tract which is causing a significant cough.  This is commonly called Bronchitis and has four common causes:   Allergies Viral Infections Acid Reflux Bacterial Infection Allergies, viruses and acid reflux are treated by controlling symptoms or eliminating the cause. An example might be a cough caused by taking certain blood pressure medications. You stop the cough by changing the medication. Another example might be a cough caused by acid reflux. Controlling the reflux helps control the cough.  USE OF BRONCHODILATOR (RESCUE) INHALERS: There is a risk from using your bronchodilator too frequently.  The risk is that over-reliance on a medication which only relaxes the muscles surrounding the breathing tubes can reduce the effectiveness of medications prescribed to reduce swelling and congestion of the tubes themselves.  Although you feel brief relief from the bronchodilator inhaler, your asthma may actually be worsening with the tubes becoming more swollen and filled with mucus.  This can delay other crucial treatments, such as oral steroid medications. If you need to use a bronchodilator inhaler daily, several times per day, you should discuss this with your provider.  There are probably better treatments that could be used to keep your  asthma under control.     HOME CARE Only take medications as instructed by your medical team. Complete the entire course of an antibiotic. Drink plenty of fluids and get plenty of rest. Avoid close contacts especially the very young and the elderly Cover your mouth if you cough or cough into your sleeve. Always remember to wash your hands A steam or ultrasonic humidifier can help congestion.   GET HELP RIGHT AWAY IF: You develop worsening fever. You become short of breath You cough up blood. Your symptoms persist after you have completed your treatment plan MAKE SURE YOU  Understand these instructions. Will watch your condition. Will get help right away if you are not doing well or get worse.  Your e-visit answers were reviewed by a board certified advanced clinical practitioner to complete your personal care plan.  Depending on the condition, your plan could have included both over the counter or prescription medications. If there is a problem please reply  once you have received a response from your provider. Your safety is important to us .  If you have drug allergies check your prescription carefully.    You can use MyChart to ask questions about todays visit, request a non-urgent call back, or ask for a work or school excuse for 24 hours related to this e-Visit. If it has been greater than 24 hours you will need to follow up with your provider, or enter a new e-Visit to address those concerns. You will get an e-mail in the next two days asking about your experience.  I hope that your e-visit  has been valuable and will speed your recovery. Thank you for using e-visits.   I have spent 5 minutes in review of e-visit questionnaire, review and updating patient chart, medical decision making and response to patient.   Roosvelt Mater, PA-C

## 2024-12-13 ENCOUNTER — Encounter: Payer: Self-pay | Admitting: *Deleted

## 2024-12-17 ENCOUNTER — Telehealth: Admitting: Physician Assistant

## 2024-12-17 DIAGNOSIS — J208 Acute bronchitis due to other specified organisms: Secondary | ICD-10-CM

## 2024-12-17 MED ORDER — ALBUTEROL SULFATE HFA 108 (90 BASE) MCG/ACT IN AERS: 1.0000 | INHALATION_SPRAY | Freq: Four times a day (QID) | RESPIRATORY_TRACT | 0 refills | Status: AC | PRN

## 2024-12-17 MED ORDER — AZITHROMYCIN 250 MG PO TABS: ORAL_TABLET | ORAL | 0 refills | Status: AC

## 2024-12-17 NOTE — Progress Notes (Signed)
 We are sorry that you are not feeling well.  Here is how we plan to help!  Based on your presentation I believe you most likely have A cough due to bacteria.  When patients have a fever and a productive cough with a change in color or increased sputum production, we are concerned about bacterial bronchitis.  If left untreated it can progress to pneumonia.  If your symptoms do not improve with your treatment plan it is important that you contact your provider.   I have prescribed Azithromyin 250 mg: two tablets now and then one tablet daily for 4 additonal days    In addition you may use A non-prescription cough medication called Mucinex DM: take 2 tablets every 12 hours.  I have prescribed Albuterol inhaler Use 1-2 puffs every 6 hours as needed for shortness of breath, chest tightness, and/or wheezing.  From your responses in the eVisit questionnaire you describe inflammation in the upper respiratory tract which is causing a significant cough.  This is commonly called Bronchitis and has four common causes:   Allergies Viral Infections Acid Reflux Bacterial Infection Allergies, viruses and acid reflux are treated by controlling symptoms or eliminating the cause. An example might be a cough caused by taking certain blood pressure medications. You stop the cough by changing the medication. Another example might be a cough caused by acid reflux. Controlling the reflux helps control the cough.  USE OF BRONCHODILATOR (RESCUE) INHALERS: There is a risk from using your bronchodilator too frequently.  The risk is that over-reliance on a medication which only relaxes the muscles surrounding the breathing tubes can reduce the effectiveness of medications prescribed to reduce swelling and congestion of the tubes themselves.  Although you feel brief relief from the bronchodilator inhaler, your asthma may actually be worsening with the tubes becoming more swollen and filled with mucus.  This can delay other  crucial treatments, such as oral steroid medications. If you need to use a bronchodilator inhaler daily, several times per day, you should discuss this with your provider.  There are probably better treatments that could be used to keep your asthma under control.     HOME CARE Only take medications as instructed by your medical team. Complete the entire course of an antibiotic. Drink plenty of fluids and get plenty of rest. Avoid close contacts especially the very young and the elderly Cover your mouth if you cough or cough into your sleeve. Always remember to wash your hands A steam or ultrasonic humidifier can help congestion.   GET HELP RIGHT AWAY IF: You develop worsening fever. You become short of breath You cough up blood. Your symptoms persist after you have completed your treatment plan MAKE SURE YOU  Understand these instructions. Will watch your condition. Will get help right away if you are not doing well or get worse.  Your e-visit answers were reviewed by a board certified advanced clinical practitioner to complete your personal care plan.  Depending on the condition, your plan could have included both over the counter or prescription medications. If there is a problem please reply  once you have received a response from your provider. Your safety is important to us .  If you have drug allergies check your prescription carefully.    You can use MyChart to ask questions about todays visit, request a non-urgent call back, or ask for a work or school excuse for 24 hours related to this e-Visit. If it has been greater than 24 hours you  will need to follow up with your provider, or enter a new e-Visit to address those concerns. You will get an e-mail in the next two days asking about your experience.  I hope that your e-visit has been valuable and will speed your recovery. Thank you for using e-visits.   I have spent 5 minutes in review of e-visit questionnaire, review and  updating patient chart, medical decision making and response to patient.   Delon CHRISTELLA Dickinson, PA-C

## 2025-01-18 ENCOUNTER — Ambulatory Visit: Admitting: Sports Medicine

## 2025-01-26 ENCOUNTER — Ambulatory Visit: Admitting: Sports Medicine
